# Patient Record
Sex: Male | Born: 1955 | Race: White | Hispanic: No | Marital: Married | Smoking: Never smoker
Health system: Southern US, Community
[De-identification: ages and names within clinical notes are randomized; demographics above are authoritative.]

## PROBLEM LIST (undated history)

## (undated) DIAGNOSIS — E119 Type 2 diabetes mellitus without complications: Secondary | ICD-10-CM

## (undated) DIAGNOSIS — R011 Cardiac murmur, unspecified: Secondary | ICD-10-CM

## (undated) DIAGNOSIS — K529 Noninfective gastroenteritis and colitis, unspecified: Secondary | ICD-10-CM

## (undated) DIAGNOSIS — H269 Unspecified cataract: Secondary | ICD-10-CM

## (undated) DIAGNOSIS — E785 Hyperlipidemia, unspecified: Secondary | ICD-10-CM

## (undated) DIAGNOSIS — E079 Disorder of thyroid, unspecified: Secondary | ICD-10-CM

## (undated) DIAGNOSIS — M1990 Unspecified osteoarthritis, unspecified site: Secondary | ICD-10-CM

## (undated) DIAGNOSIS — K5190 Ulcerative colitis, unspecified, without complications: Secondary | ICD-10-CM

## (undated) DIAGNOSIS — I10 Essential (primary) hypertension: Secondary | ICD-10-CM

## (undated) HISTORY — DX: Noninfective gastroenteritis and colitis, unspecified: K52.9

## (undated) HISTORY — PX: HERNIA REPAIR: SHX51

## (undated) HISTORY — DX: Type 2 diabetes mellitus without complications: E11.9

## (undated) HISTORY — DX: Cardiac murmur, unspecified: R01.1

## (undated) HISTORY — DX: Ulcerative colitis, unspecified, without complications: K51.90

## (undated) HISTORY — DX: Hyperlipidemia, unspecified: E78.5

## (undated) HISTORY — DX: Essential (primary) hypertension: I10

## (undated) HISTORY — DX: Disorder of thyroid, unspecified: E07.9

## (undated) HISTORY — PX: WISDOM TOOTH EXTRACTION: SHX21

## (undated) HISTORY — DX: Unspecified cataract: H26.9

## (undated) HISTORY — DX: Unspecified osteoarthritis, unspecified site: M19.90

---

## 2020-06-03 LAB — LIPID PANEL
Cholesterol: 137 (ref 0–200)
HDL: 46 (ref 35–70)
LDL Cholesterol: 71
Triglycerides: 122 (ref 40–160)

## 2020-06-03 LAB — PSA: PSA: 0.89

## 2020-06-03 LAB — MICROALBUMIN, URINE: Microalb, Ur: 12.2

## 2020-06-03 LAB — HEPATIC FUNCTION PANEL
ALT: 19 U/L (ref 10–40)
AST: 20 (ref 14–40)
Alkaline Phosphatase: 75 (ref 25–125)

## 2020-06-03 LAB — MICROALBUMIN / CREATININE URINE RATIO: Microalb Creat Ratio: 197

## 2020-09-17 LAB — HM COLONOSCOPY

## 2021-02-11 LAB — BASIC METABOLIC PANEL
BUN: 12 (ref 4–21)
CO2: 30 — AB (ref 13–22)
Chloride: 104 (ref 99–108)
Creatinine: 0.5 — AB (ref 0.6–1.3)
Glucose: 186
Potassium: 4.3 mEq/L (ref 3.5–5.1)
Sodium: 140 (ref 137–147)

## 2021-02-11 LAB — HEMOGLOBIN A1C: Hemoglobin A1C: 7.5

## 2021-02-11 LAB — COMPREHENSIVE METABOLIC PANEL
Calcium: 9.6 (ref 8.7–10.7)
eGFR: 90

## 2021-03-18 DIAGNOSIS — H353231 Exudative age-related macular degeneration, bilateral, with active choroidal neovascularization: Secondary | ICD-10-CM | POA: Diagnosis not present

## 2021-03-24 DIAGNOSIS — Z20822 Contact with and (suspected) exposure to covid-19: Secondary | ICD-10-CM | POA: Diagnosis not present

## 2021-04-15 DIAGNOSIS — H353231 Exudative age-related macular degeneration, bilateral, with active choroidal neovascularization: Secondary | ICD-10-CM | POA: Diagnosis not present

## 2021-05-06 ENCOUNTER — Ambulatory Visit (INDEPENDENT_AMBULATORY_CARE_PROVIDER_SITE_OTHER): Payer: Medicare Other | Admitting: Internal Medicine

## 2021-05-06 ENCOUNTER — Other Ambulatory Visit: Payer: Self-pay

## 2021-05-06 ENCOUNTER — Encounter: Payer: Self-pay | Admitting: Internal Medicine

## 2021-05-06 VITALS — BP 148/76 | HR 62 | Temp 98.2°F | Resp 16 | Ht 70.0 in | Wt 184.0 lb

## 2021-05-06 DIAGNOSIS — K5190 Ulcerative colitis, unspecified, without complications: Secondary | ICD-10-CM | POA: Insufficient documentation

## 2021-05-06 DIAGNOSIS — K5180 Other ulcerative colitis without complications: Secondary | ICD-10-CM

## 2021-05-06 DIAGNOSIS — Z0001 Encounter for general adult medical examination with abnormal findings: Secondary | ICD-10-CM | POA: Insufficient documentation

## 2021-05-06 DIAGNOSIS — E040 Nontoxic diffuse goiter: Secondary | ICD-10-CM | POA: Insufficient documentation

## 2021-05-06 DIAGNOSIS — I350 Nonrheumatic aortic (valve) stenosis: Secondary | ICD-10-CM | POA: Diagnosis not present

## 2021-05-06 DIAGNOSIS — I10 Essential (primary) hypertension: Secondary | ICD-10-CM | POA: Diagnosis not present

## 2021-05-06 DIAGNOSIS — E785 Hyperlipidemia, unspecified: Secondary | ICD-10-CM | POA: Diagnosis not present

## 2021-05-06 DIAGNOSIS — E118 Type 2 diabetes mellitus with unspecified complications: Secondary | ICD-10-CM

## 2021-05-06 DIAGNOSIS — E119 Type 2 diabetes mellitus without complications: Secondary | ICD-10-CM | POA: Diagnosis not present

## 2021-05-06 DIAGNOSIS — I351 Nonrheumatic aortic (valve) insufficiency: Secondary | ICD-10-CM | POA: Diagnosis not present

## 2021-05-06 DIAGNOSIS — R9431 Abnormal electrocardiogram [ECG] [EKG]: Secondary | ICD-10-CM | POA: Insufficient documentation

## 2021-05-06 DIAGNOSIS — Z23 Encounter for immunization: Secondary | ICD-10-CM | POA: Insufficient documentation

## 2021-05-06 MED ORDER — ASPIRIN 81 MG PO TBEC: 81.0000 mg | DELAYED_RELEASE_TABLET | Freq: Every day | ORAL | 1 refills | Status: DC

## 2021-05-06 MED ORDER — BOOSTRIX 5-2.5-18.5 LF-MCG/0.5 IM SUSP: 0.5000 mL | Freq: Once | INTRAMUSCULAR | 0 refills | Status: AC

## 2021-05-06 MED ORDER — SHINGRIX 50 MCG/0.5ML IM SUSR: 0.5000 mL | Freq: Once | INTRAMUSCULAR | 1 refills | Status: AC

## 2021-05-06 MED ORDER — OLMESARTAN MEDOXOMIL 20 MG PO TABS: 20.0000 mg | ORAL_TABLET | Freq: Every day | ORAL | 0 refills | Status: DC

## 2021-05-06 NOTE — Patient Instructions (Signed)

## 2021-05-06 NOTE — Progress Notes (Signed)
Subjective:  Patient ID: Bruce Sanchez, male    DOB: Jan 10, 1956  Age: 66 y.o. MRN: [id number]  CC: Annual Exam, Hypertension, Hyperlipidemia, and Diabetes  This visit occurred during the SARS-CoV-2 public health emergency.  Safety protocols were in place, including screening questions prior to the visit, additional usage of staff PPE, and extensive cleaning of exam room while observing appropriate contact time as indicated for disinfecting solutions.    HPI Bruce Sanchez presents for a CPX and to establish.  He had an episode of chest pressure one month ago. He has had no more CP since then.  [place] and he was told by his previous physician that he needed to have an evaluation for a heart murmur.  He denies palpitations, dizziness, lightheadedness, shortness of breath, dyspnea on exertion, edema, or fatigue.  He has chronic diarrhea caused by ulcerative colitis but denies any recent episodes of bloody stool, melena, abdominal pain, nausea, vomiting, loss of appetite, or weight loss.  He has a right thyroid goiter.  History Bruce Sanchez has a past medical history of Colitis, Diabetes mellitus without complication (HCC), Hyperlipidemia, and Hypertension.   He has a past surgical history that includes Hernia repair and Wisdom tooth extraction.   His family history includes Arthritis in his mother; Diabetes in his brother and mother; Heart attack in his father and mother; Heart disease in his father.He reports that he has never smoked. He has never used smokeless tobacco. He reports current alcohol use of about 7.0 standard drinks per week. He reports that he does not use drugs.  Outpatient Medications Prior to Visit  Medication Sig Dispense Refill   amLODipine-atorvastatin (CADUET) 10-40 MG tablet Take 1 tablet by mouth daily.     atorvastatin (LIPITOR) 40 MG tablet Take 40 mg by mouth daily.     dicyclomine (BENTYL) 10 MG capsule Take by mouth.     Mesalamine 800 MG  TBEC Take by mouth.     metFORMIN (GLUCOPHAGE-XR) 750 MG 24 hr tablet Take by mouth.     metoprolol succinate (TOPROL-XL) 50 MG 24 hr tablet Take 1 tablet by mouth daily.     sulfaSALAzine (AZULFIDINE) 500 MG tablet Take 1,000 mg by mouth 2 (two) times daily.     empagliflozin (JARDIANCE) 10 MG TABS tablet Take 1 tablet by mouth daily.     No facility-administered medications prior to visit.    ROS Review of Systems  Constitutional:  Negative for chills, diaphoresis, fatigue and fever.  HENT: Negative.    Eyes: Negative.   Respiratory:  Negative for cough, chest tightness, shortness of breath and wheezing.   Cardiovascular:  Negative for chest pain, palpitations and leg swelling.  Gastrointestinal:  Positive for diarrhea. Negative for abdominal pain, blood in stool, constipation, nausea, rectal pain and vomiting.  Endocrine: Negative.   Genitourinary: Negative.  Negative for difficulty urinating, penile swelling and scrotal swelling.  Musculoskeletal:  Negative for arthralgias and myalgias.  Skin: Negative.   Neurological:  Negative for dizziness, weakness, light-headedness and headaches.  Hematological:  Negative for adenopathy. Does not bruise/bleed easily.  Psychiatric/Behavioral: Negative.     Objective:  BP (!) 148/76 (BP Location: Right Arm, Patient Position: Sitting, Cuff Size: Large)    Pulse 62    Temp 98.2 F (36.8 C) (Oral)    Resp 16    Ht 5\' 10"  (1.778 m)    Wt 184 lb (83.5 kg)    SpO2 93%    BMI 26.40 kg/m  Physical Exam Vitals reviewed.  HENT:     Nose: Nose normal.     Mouth/Throat:     Mouth: Mucous membranes are moist.  Eyes:     General: No scleral icterus.    Conjunctiva/sclera: Conjunctivae normal.  Neck:     Thyroid: Thyroid mass and thyromegaly present. No thyroid tenderness.   Cardiovascular:     Rate and Rhythm: Normal rate and regular rhythm.     Heart sounds: Murmur heard.  Crescendo systolic murmur is present with a grade of 1/6.    No  friction rub. No gallop.     Comments: 1/6 Harsh SEM RUSB  EKG- NSR, 68 bpm Septal infarct pattern No LVH No prior EKG's available Pulmonary:     Effort: Pulmonary effort is normal.     Breath sounds: No stridor. No wheezing, rhonchi or rales.  Abdominal:     General: Abdomen is flat.     Palpations: There is no mass.     Tenderness: There is no abdominal tenderness. There is no guarding or rebound.     Hernia: No hernia is present.  Musculoskeletal:     Cervical back: Normal range of motion.     Right lower leg: No edema.     Left lower leg: No edema.  Lymphadenopathy:     Cervical: No cervical adenopathy.  Skin:    General: Skin is dry.  Neurological:     General: No focal deficit present.     Mental Status: He is alert. Mental status is at baseline.  Psychiatric:        Mood and Affect: Mood normal.        Behavior: Behavior normal.    Lab Results  Component Value Date   CHOL 137 06/03/2020   TRIG 122 06/03/2020   HDL 46 06/03/2020   LDLCALC 71 06/03/2020   ALT 19 06/03/2020   AST 20 06/03/2020   NA 140 02/11/2021   K 4.3 02/11/2021   CL 104 02/11/2021   CREATININE 0.5 (A) 02/11/2021   BUN 12 02/11/2021   CO2 30 (A) 02/11/2021   PSA 0.89 06/03/2020   HGBA1C 7.5 02/11/2021   MICROALBUR 12.2 06/03/2020     Assessment & Plan:   Bruce Sanchez was seen today for annual exam, hypertension, hyperlipidemia and diabetes.  Diagnoses and all orders for this visit:  Encounter for general adult medical examination with abnormal findings- Exam completed, labs reviewed, vaccines reviewed and updated, cancer screenings are up-to-date, patient education was given.  Type II diabetes mellitus with manifestations (HCC)- His last A1c was 7.5%.  Will increase the dose of the SGLT2 inhibitor. -     HM Diabetes Foot Exam -     olmesartan (BENICAR) 20 MG tablet; Take 1 tablet (20 mg total) by mouth daily. -     empagliflozin (JARDIANCE) 25 MG TABS tablet; Take 1 tablet (25 mg total)  by mouth daily before breakfast. -     Ambulatory referral to Ophthalmology  Primary hypertension- He has not achieved his blood pressure goal of 130/80.  Will start an ARB. -     EKG 12-Lead -     olmesartan (BENICAR) 20 MG tablet; Take 1 tablet (20 mg total) by mouth daily.  Aortic ejection murmur- Will evaluate for valvular heart disease. -     EKG 12-Lead -     ECHOCARDIOGRAM COMPLETE; Future -     MYOCARDIAL PERFUSION IMAGING; Future  Need for shingles vaccine -     Zoster  Vaccine Adjuvanted [facility]) injection; Inject 0.5 mLs into the muscle once for 1 dose.  Need for prophylactic vaccination with combined diphtheria-tetanus-pertussis (DTP) vaccine -     Tdap (BOOSTRIX) 5-2.5-18.5 LF-MCG/0.5 injection; Inject 0.5 mLs into the muscle once for 1 dose.  Abnormal electrocardiogram (ECG) (EKG)- Will evaluate for ischemia and infarct. -     ECHOCARDIOGRAM COMPLETE; Future -     MYOCARDIAL PERFUSION IMAGING; Future -     aspirin 81 MG EC tablet; Take 1 tablet (81 mg total) by mouth daily. Swallow whole. -     Cardiac Stress Test: Informed Consent Details: Physician/Practitioner Attestation; Transcribe to consent form and obtain patient signature; Future  Abnormal electrocardiogram -     ECHOCARDIOGRAM COMPLETE; Future -     MYOCARDIAL PERFUSION IMAGING; Future -     aspirin 81 MG EC tablet; Take 1 tablet (81 mg total) by mouth daily. Swallow whole. -     Cardiac Stress Test: Informed Consent Details: Physician/Practitioner Attestation; Transcribe to consent form and obtain patient signature; Future  Goiter, simple- He is euthyroid.  Hyperlipidemia LDL goal <70- LDL goal achieved. Doing well on the statin  -     aspirin 81 MG EC tablet; Take 1 tablet (81 mg total) by mouth daily. Swallow whole.  Other ulcerative colitis without complication (HCC) -     Ambulatory referral to Gastroenterology   I have discontinued Bruce Fast Loppnow's empagliflozin. I am also having him start on  Boostrix, Shingrix, olmesartan, aspirin, and empagliflozin. Additionally, I am having him maintain his amLODipine-atorvastatin, atorvastatin, dicyclomine, Mesalamine, metFORMIN, metoprolol succinate, and sulfaSALAzine.  Meds ordered this encounter  Medications   Tdap (BOOSTRIX) 5-2.5-18.5 LF-MCG/0.5 injection    Sig: Inject 0.5 mLs into the muscle once for 1 dose.    Dispense:  0.5 mL    Refill:  0   Zoster Vaccine Adjuvanted [facility]) injection    Sig: Inject 0.5 mLs into the muscle once for 1 dose.    Dispense:  0.5 mL    Refill:  1   olmesartan (BENICAR) 20 MG tablet    Sig: Take 1 tablet (20 mg total) by mouth daily.    Dispense:  90 tablet    Refill:  0   aspirin 81 MG EC tablet    Sig: Take 1 tablet (81 mg total) by mouth daily. Swallow whole.    Dispense:  90 tablet    Refill:  1   empagliflozin (JARDIANCE) 25 MG TABS tablet    Sig: Take 1 tablet (25 mg total) by mouth daily before breakfast.    Dispense:  30 tablet    Refill:  1     Follow-up: Return in about 3 months (around 08/06/2021).  Sanda Linger, MD

## 2021-05-08 ENCOUNTER — Encounter: Payer: Self-pay | Admitting: Internal Medicine

## 2021-05-08 MED ORDER — EMPAGLIFLOZIN 25 MG PO TABS: 25.0000 mg | ORAL_TABLET | Freq: Every day | ORAL | 1 refills | Status: DC

## 2021-05-13 ENCOUNTER — Telehealth: Payer: Self-pay

## 2021-05-13 NOTE — Telephone Encounter (Signed)
Pt has been informed that cardio testing was entered not a referral. He expressed understanding but stated no one has reached out to schedule the testing. I will follow up with referral team in regard. ?

## 2021-05-13 NOTE — Telephone Encounter (Signed)
Pt is calling to check the update on the Cardiology referral. Pt states he havent heard from anyone as far as referrals. ? ?I advised pt it was a referral in for: ?Ophthalmology ?Gastroenterology ? ?FYI ?

## 2021-05-19 ENCOUNTER — Telehealth (HOSPITAL_COMMUNITY): Payer: Self-pay | Admitting: *Deleted

## 2021-05-19 ENCOUNTER — Encounter (HOSPITAL_COMMUNITY): Payer: Self-pay | Admitting: Internal Medicine

## 2021-05-19 NOTE — Telephone Encounter (Signed)
Patient given detailed instructions per Myocardial Perfusion Study Information Sheet for the test on 05/20/21 Patient notified to arrive 15 minutes early and that it is imperative to arrive on time for appointment to keep from having the test rescheduled. ? If you need to cancel or reschedule your appointment, please call the office within 24 hours of your appointment. . Patient verbalized understanding. Bruce Sanchez [name] ? ? ?

## 2021-05-20 ENCOUNTER — Ambulatory Visit (HOSPITAL_COMMUNITY): Payer: Medicare Other | Attending: Internal Medicine

## 2021-05-20 ENCOUNTER — Other Ambulatory Visit: Payer: Self-pay

## 2021-05-20 DIAGNOSIS — R9431 Abnormal electrocardiogram [ECG] [EKG]: Secondary | ICD-10-CM | POA: Diagnosis not present

## 2021-05-20 DIAGNOSIS — I351 Nonrheumatic aortic (valve) insufficiency: Secondary | ICD-10-CM | POA: Insufficient documentation

## 2021-05-20 LAB — MYOCARDIAL PERFUSION IMAGING
LV dias vol: 115 mL (ref 62–150)
LV sys vol: 51 mL
Nuc Stress EF: 72 %
Peak HR: 112 {beats}/min
Rest HR: 71 {beats}/min
Rest Nuclear Isotope Dose: 10.8 mCi
SDS: 1
SRS: 0
SSS: 1
ST Depression (mm): 0 mm
Stress Nuclear Isotope Dose: 32.9 mCi
TID: 1.02

## 2021-05-20 MED ORDER — REGADENOSON 0.4 MG/5ML IV SOLN
0.4000 mg | Freq: Once | INTRAVENOUS | Status: AC
  Administered 2021-05-20: 0.4 mg via INTRAVENOUS

## 2021-05-20 MED ORDER — TECHNETIUM TC 99M TETROFOSMIN IV KIT
32.9000 | PACK | Freq: Once | INTRAVENOUS | Status: AC | PRN
  Administered 2021-05-20: 32.9 via INTRAVENOUS
  Filled 2021-05-20: qty 33

## 2021-05-20 MED ORDER — TECHNETIUM TC 99M TETROFOSMIN IV KIT
10.8000 | PACK | Freq: Once | INTRAVENOUS | Status: AC | PRN
  Administered 2021-05-20: 10.8 via INTRAVENOUS
  Filled 2021-05-20: qty 11

## 2021-05-27 DIAGNOSIS — H353231 Exudative age-related macular degeneration, bilateral, with active choroidal neovascularization: Secondary | ICD-10-CM | POA: Diagnosis not present

## 2021-06-02 ENCOUNTER — Ambulatory Visit (INDEPENDENT_AMBULATORY_CARE_PROVIDER_SITE_OTHER): Payer: Medicare Other

## 2021-06-02 DIAGNOSIS — I350 Nonrheumatic aortic (valve) stenosis: Secondary | ICD-10-CM | POA: Insufficient documentation

## 2021-06-02 DIAGNOSIS — R9431 Abnormal electrocardiogram [ECG] [EKG]: Secondary | ICD-10-CM | POA: Diagnosis not present

## 2021-06-02 DIAGNOSIS — I351 Nonrheumatic aortic (valve) insufficiency: Secondary | ICD-10-CM

## 2021-06-02 LAB — ECHOCARDIOGRAM COMPLETE
AR max vel: 1.26 cm2
AV Area VTI: 1.21 cm2
AV Area mean vel: 1.13 cm2
AV Mean grad: 15 mmHg
AV Peak grad: 25.9 mmHg
Ao pk vel: 2.54 m/s
Area-P 1/2: 2.76 cm2
Calc EF: 64.6 %
S' Lateral: 2.55 cm
Single Plane A2C EF: 58.3 %
Single Plane A4C EF: 69.9 %

## 2021-07-08 DIAGNOSIS — H2513 Age-related nuclear cataract, bilateral: Secondary | ICD-10-CM | POA: Diagnosis not present

## 2021-07-08 DIAGNOSIS — H353231 Exudative age-related macular degeneration, bilateral, with active choroidal neovascularization: Secondary | ICD-10-CM | POA: Diagnosis not present

## 2021-07-08 LAB — HM DIABETES EYE EXAM

## 2021-07-28 ENCOUNTER — Encounter: Payer: Self-pay | Admitting: Internal Medicine

## 2021-08-03 ENCOUNTER — Other Ambulatory Visit: Payer: Self-pay | Admitting: Internal Medicine

## 2021-08-03 DIAGNOSIS — I10 Essential (primary) hypertension: Secondary | ICD-10-CM

## 2021-08-03 DIAGNOSIS — E118 Type 2 diabetes mellitus with unspecified complications: Secondary | ICD-10-CM

## 2021-08-05 DIAGNOSIS — E119 Type 2 diabetes mellitus without complications: Secondary | ICD-10-CM | POA: Diagnosis not present

## 2021-08-11 ENCOUNTER — Ambulatory Visit (INDEPENDENT_AMBULATORY_CARE_PROVIDER_SITE_OTHER): Payer: Medicare Other

## 2021-08-11 ENCOUNTER — Ambulatory Visit (INDEPENDENT_AMBULATORY_CARE_PROVIDER_SITE_OTHER): Payer: Medicare Other | Admitting: Internal Medicine

## 2021-08-11 ENCOUNTER — Encounter: Payer: Self-pay | Admitting: Internal Medicine

## 2021-08-11 VITALS — BP 136/64 | HR 79 | Temp 98.0°F | Ht 70.0 in | Wt 186.0 lb

## 2021-08-11 DIAGNOSIS — E118 Type 2 diabetes mellitus with unspecified complications: Secondary | ICD-10-CM | POA: Diagnosis not present

## 2021-08-11 DIAGNOSIS — K5180 Other ulcerative colitis without complications: Secondary | ICD-10-CM | POA: Diagnosis not present

## 2021-08-11 DIAGNOSIS — E040 Nontoxic diffuse goiter: Secondary | ICD-10-CM | POA: Diagnosis not present

## 2021-08-11 DIAGNOSIS — M542 Cervicalgia: Secondary | ICD-10-CM

## 2021-08-11 DIAGNOSIS — M8588 Other specified disorders of bone density and structure, other site: Secondary | ICD-10-CM

## 2021-08-11 DIAGNOSIS — G8929 Other chronic pain: Secondary | ICD-10-CM

## 2021-08-11 DIAGNOSIS — R937 Abnormal findings on diagnostic imaging of other parts of musculoskeletal system: Secondary | ICD-10-CM

## 2021-08-11 DIAGNOSIS — I350 Nonrheumatic aortic (valve) stenosis: Secondary | ICD-10-CM | POA: Diagnosis not present

## 2021-08-11 DIAGNOSIS — I10 Essential (primary) hypertension: Secondary | ICD-10-CM

## 2021-08-11 DIAGNOSIS — Z125 Encounter for screening for malignant neoplasm of prostate: Secondary | ICD-10-CM

## 2021-08-11 DIAGNOSIS — E785 Hyperlipidemia, unspecified: Secondary | ICD-10-CM

## 2021-08-11 DIAGNOSIS — Z23 Encounter for immunization: Secondary | ICD-10-CM | POA: Diagnosis not present

## 2021-08-11 LAB — MICROALBUMIN / CREATININE URINE RATIO
Creatinine,U: 128.3 mg/dL
Microalb Creat Ratio: 12.3 mg/g (ref 0.0–30.0)
Microalb, Ur: 15.8 mg/dL — ABNORMAL HIGH (ref 0.0–1.9)

## 2021-08-11 LAB — BASIC METABOLIC PANEL
BUN: 14 mg/dL (ref 6–23)
CO2: 32 mEq/L (ref 19–32)
Calcium: 9.4 mg/dL (ref 8.4–10.5)
Chloride: 101 mEq/L (ref 96–112)
Creatinine, Ser: 0.64 mg/dL (ref 0.40–1.50)
GFR: 97.59 mL/min (ref >60.00)
Glucose, Bld: 174 mg/dL — ABNORMAL HIGH (ref 70–99)
Potassium: 3.8 mEq/L (ref 3.5–5.1)
Sodium: 140 mEq/L (ref 135–145)

## 2021-08-11 LAB — LIPID PANEL
Cholesterol: 111 mg/dL (ref 0–200)
HDL: 44.5 mg/dL (ref >39.00)
LDL Cholesterol: 48 mg/dL (ref 0–99)
NonHDL: 66.4
Total CHOL/HDL Ratio: 2
Triglycerides: 90 mg/dL (ref 0.0–149.0)
VLDL: 18 mg/dL (ref 0.0–40.0)

## 2021-08-11 LAB — TSH: TSH: 1.14 u[IU]/mL (ref 0.35–5.50)

## 2021-08-11 LAB — PSA: PSA: 0.95 ng/mL (ref 0.10–4.00)

## 2021-08-11 LAB — HEMOGLOBIN A1C: Hgb A1c MFr Bld: 6 % (ref 4.6–6.5)

## 2021-08-11 MED ORDER — BOOSTRIX 5-2.5-18.5 LF-MCG/0.5 IM SUSP: 0.5000 mL | Freq: Once | INTRAMUSCULAR | 0 refills | Status: AC

## 2021-08-11 NOTE — Patient Instructions (Signed)

## 2021-08-11 NOTE — Progress Notes (Unsigned)
Subjective:  Patient ID: Bruce Sanchez, male    DOB: 1955-09-20  Age: 66 y.o. MRN: [id number]  CC: Hypertension, Hyperlipidemia, and Diabetes   HPI Bruce Sanchez presents for f/up -  His last episode of shortness of breath was 3 weeks ago.  He denies chest pain, diaphoresis, dizziness, lightheadedness, or near syncope.  He tells me that mesalamine is more effective than sulfasalazine and he requests that I discontinue sulfasalazine and start mesalamine.  He also complains of chronic worsening posterior neck pain.  The pain does not radiate into his extremities and he denies paresthesias.  He has been taking Motrin for the pain.  He is due for follow-up on a thyroid goiter.  He thinks it is growing but it does not cause any trouble with swallowing or breathing.  His recent echocardiogram showed moderate aortic stenosis.  Outpatient Medications Prior to Visit  Medication Sig Dispense Refill   amLODipine-atorvastatin (CADUET) 10-40 MG tablet Take 1 tablet by mouth daily.     aspirin 81 MG EC tablet Take 1 tablet (81 mg total) by mouth daily. Swallow whole. 90 tablet 1   atorvastatin (LIPITOR) 40 MG tablet Take 40 mg by mouth daily.     empagliflozin (JARDIANCE) 25 MG TABS tablet Take 1 tablet (25 mg total) by mouth daily before breakfast. 30 tablet 1   metFORMIN (GLUCOPHAGE-XR) 750 MG 24 hr tablet Take by mouth.     olmesartan (BENICAR) 20 MG tablet TAKE ONE TABLET BY MOUTH DAILY 90 tablet 0   Mesalamine 800 MG TBEC Take by mouth.     sulfaSALAzine (AZULFIDINE) 500 MG tablet Take 1,000 mg by mouth 2 (two) times daily.     dicyclomine (BENTYL) 10 MG capsule Take by mouth.     metoprolol succinate (TOPROL-XL) 50 MG 24 hr tablet Take 1 tablet by mouth daily.     No facility-administered medications prior to visit.    ROS Review of Systems  Constitutional: Negative.  Negative for diaphoresis and fatigue.  HENT: Negative.  Negative for sore throat and trouble swallowing.   Eyes: Negative.    Respiratory:  Positive for shortness of breath. Negative for cough, chest tightness, wheezing and stridor.   Cardiovascular:  Negative for chest pain, palpitations and leg swelling.  Gastrointestinal:  Positive for diarrhea. Negative for abdominal pain, anal bleeding, blood in stool, nausea, rectal pain and vomiting.  Genitourinary: Negative.   Musculoskeletal:  Positive for neck pain. Negative for back pain and myalgias.  Skin: Negative.  Negative for rash.  Neurological: Negative.  Negative for dizziness, weakness and numbness.  Hematological:  Negative for adenopathy. Does not bruise/bleed easily.    Objective:  BP 136/64 (BP Location: Right Arm, Patient Position: Sitting, Cuff Size: Large)   Pulse 79   Temp 98 F (36.7 C) (Oral)   Ht 5\' 10"  (1.778 m)   Wt 186 lb (84.4 kg)   SpO2 93%   BMI 26.69 kg/m   BP Readings from Last 3 Encounters:  08/11/21 136/64  05/06/21 (!) 148/76    Wt Readings from Last 3 Encounters:  08/11/21 186 lb (84.4 kg)  05/20/21 184 lb (83.5 kg)  05/06/21 184 lb (83.5 kg)    Physical Exam Vitals reviewed.  HENT:     Nose: Nose normal.     Mouth/Throat:     Mouth: Mucous membranes are moist.  Eyes:     General: No scleral icterus.    Conjunctiva/sclera: Conjunctivae normal.  Neck:     Thyroid: Thyromegaly present.  Vascular: No carotid bruit.  Cardiovascular:     Heart sounds: No murmur heard. Pulmonary:     Effort: Pulmonary effort is normal.     Breath sounds: No stridor. No wheezing, rhonchi or rales.  Abdominal:     General: Abdomen is flat.     Palpations: There is no mass.     Tenderness: There is no abdominal tenderness. There is no guarding.     Hernia: No hernia is present.  Musculoskeletal:        General: Normal range of motion.     Cervical back: Normal range of motion and neck supple. No erythema, spasms, tenderness or bony tenderness. No pain with movement. Normal range of motion.     Thoracic back: Normal.     Lumbar  back: Normal.     Right lower leg: No edema.     Left lower leg: No edema.  Lymphadenopathy:     Cervical: No cervical adenopathy.     Right cervical: No superficial, deep or posterior cervical adenopathy.    Left cervical: No superficial, deep or posterior cervical adenopathy.  Skin:    General: Skin is warm and dry.  Neurological:     General: No focal deficit present.     Mental Status: He is alert. Mental status is at baseline.     Sensory: Sensation is intact.     Motor: Motor function is intact.     Coordination: Coordination is intact.     Gait: Gait is intact.     Deep Tendon Reflexes: Reflexes normal.  Psychiatric:        Mood and Affect: Mood normal.        Behavior: Behavior normal.     Lab Results  Component Value Date   GLUCOSE 174 (H) 08/11/2021   CHOL 111 08/11/2021   TRIG 90.0 08/11/2021   HDL 44.50 08/11/2021   LDLCALC 48 08/11/2021   ALT 19 06/03/2020   AST 20 06/03/2020   NA 140 08/11/2021   K 3.8 08/11/2021   CL 101 08/11/2021   CREATININE 0.64 08/11/2021   BUN 14 08/11/2021   CO2 32 08/11/2021   TSH 1.14 08/11/2021   PSA 0.95 08/11/2021   HGBA1C 6.0 08/11/2021   MICROALBUR 15.8 (H) 08/11/2021    DG Cervical Spine Complete  Result Date: 08/12/2021 CLINICAL DATA:  Neck pain.  No known injury. EXAM: CERVICAL SPINE - COMPLETE 4+ VIEW COMPARISON:  None Available. FINDINGS: No acute fracture or dislocation. Apparent slight anterior positioning of the tip of the odontoid in relation to the occipital condyle, likely positional or related to degenerative changes. CT may provide better evaluation if there is clinical concern for acute injury. The bones are osteopenic. Bridging osteophyte anterior to the spine consistent with diffuse idiopathic skeletal hyperostosis. The visualized posterior elements and odontoid intact. There is anatomic alignment of the lateral masses of C1 and C2. The soft tissues are unremarkable. IMPRESSION: 1. No acute fracture or  dislocation. 2. Apparent slight anterior positioning of the tip of the odontoid in relation to the occipital condyle, likely positional or related to degenerative changes. CT may provide better evaluation. Electronically Signed   By: Anner Crete M.D.   On: 08/12/2021 22:41     Assessment & Plan:   Bruce Sanchez was seen today for hypertension, hyperlipidemia and diabetes.  Diagnoses and all orders for this visit:  Chronic midline posterior neck pain- There is an abnormality in the odontoid.  I recommended that he undergo a CT  scan to further delineate this. -     DG Cervical Spine Complete; Future -     CT CERVICAL SPINE WO CONTRAST; Future  Goiter, simple- He is euthyroid. -     TSH; Future -     US THYROID; Future -     TSH  Type II diabetes mellitus with manifestations ([place])- His A1c is down to 6.0%.  Will discontinue metformin. -     Microalbumin / creatinine urine ratio; Future -     Hemoglobin A1c; Future -     Basic metabolic panel; Future -     Basic metabolic panel -     Hemoglobin A1c -     Microalbumin / creatinine urine ratio  Hyperlipidemia LDL goal <70- LDL goal achieved. Doing well on the statin  -     Lipid panel; Future -     Lipid panel  Need for prophylactic vaccination with combined diphtheria-tetanus-pertussis (DTP) vaccine -     Tdap (BOOSTRIX) 5-2.5-18.5 LF-MCG/0.5 injection; Inject 0.5 mLs into the muscle once for 1 dose.  Primary hypertension- His blood pressure is well controlled. -     Urinalysis, Routine w reflex microscopic; Future -     Basic metabolic panel; Future -     Basic metabolic panel -     Urinalysis, Routine w reflex microscopic  Screening for prostate cancer -     PSA; Future -     PSA  Aortic stenosis, mild- I have asked him to see cardiology to see if this is contributing to the shortness of breath. -     Ambulatory referral to Cardiology  Osteopenia of spine- I recommended that he undergo a bone density test. -     DG Bone  Density; Future  Abnormal x-ray of cervical spine -     CT CERVICAL SPINE WO CONTRAST; Future  Cervicalgia -     CT CERVICAL SPINE WO CONTRAST; Future  Other ulcerative colitis without complication ([place])- Will change to mesalamine at his request. -     Mesalamine 800 MG TBEC; Take 1 tablet (800 mg total) by mouth 2 (two) times daily.   I have discontinued Bruce Sanchez's sulfaSALAzine. I have also changed his Mesalamine. Additionally, I am having him start on Boostrix. Lastly, I am having him maintain his amLODipine-atorvastatin, atorvastatin, dicyclomine, metFORMIN, metoprolol succinate, aspirin EC, empagliflozin, and olmesartan.  Meds ordered this encounter  Medications   Tdap (BOOSTRIX) 5-2.5-18.5 LF-MCG/0.5 injection    Sig: Inject 0.5 mLs into the muscle once for 1 dose.    Dispense:  0.5 mL    Refill:  0   Mesalamine 800 MG TBEC    Sig: Take 1 tablet (800 mg total) by mouth 2 (two) times daily.    Dispense:  180 tablet    Refill:  1   I spent 50 minutes in preparing to see the patient by review of recent labs, imaging and procedures, obtaining and reviewing separately obtained history, communicating with the patient and his wife, ordering medications, tests or procedures, and documenting clinical information in the EHR including the differential Dx, treatment, and any further evaluation and management of multiple complex medical issues.     Follow-up: Return in about 3 months (around 11/11/2021).  Scarlette Calico, MD

## 2021-08-12 LAB — URINALYSIS, ROUTINE W REFLEX MICROSCOPIC
Bilirubin Urine: NEGATIVE
Hgb urine dipstick: NEGATIVE
Leukocytes,Ua: NEGATIVE
Nitrite: NEGATIVE
RBC / HPF: NONE SEEN (ref 0–?)
Specific Gravity, Urine: >=1.03 — AB (ref 1.000–1.030)
Urine Glucose: >=1000 — AB
Urobilinogen, UA: 1 (ref 0.0–1.0)
pH: 5.5 (ref 5.0–8.0)

## 2021-08-13 DIAGNOSIS — M8588 Other specified disorders of bone density and structure, other site: Secondary | ICD-10-CM | POA: Insufficient documentation

## 2021-08-13 DIAGNOSIS — R937 Abnormal findings on diagnostic imaging of other parts of musculoskeletal system: Secondary | ICD-10-CM | POA: Insufficient documentation

## 2021-08-13 DIAGNOSIS — Z125 Encounter for screening for malignant neoplasm of prostate: Secondary | ICD-10-CM | POA: Insufficient documentation

## 2021-08-15 MED ORDER — MESALAMINE 800 MG PO TBEC: 1.0000 | DELAYED_RELEASE_TABLET | Freq: Two times a day (BID) | ORAL | 1 refills | Status: DC

## 2021-08-24 ENCOUNTER — Telehealth: Payer: Self-pay | Admitting: Gastroenterology

## 2021-08-26 DIAGNOSIS — H353231 Exudative age-related macular degeneration, bilateral, with active choroidal neovascularization: Secondary | ICD-10-CM | POA: Diagnosis not present

## 2021-08-26 NOTE — Telephone Encounter (Signed)
Records reviewed and notable for the following:  Past medical history: - Diabetes - HTN - HLD - Ulcerative colitis - OSA  Past surgical history: - Umbilical hernia repair - Left inguinal hernia repair  Medications: - Amlodipine-atorvastatin - ASA 81 mg - Lipitor 40 mg - Bentyl 10 mg - Jardiance 25 mg - Mesalamine 800 mg bid -Toprol-XL 50 mg daily - Olmesartan 20 mg daily  History of left-sided colitis diagnosed around age 66 maintained on mesalamine monotherapy.  Previously treated with sulfasalazine, acyclovir.  Last note from [facility] in [place] was 04/29/2009.  On that note, reportedly had a colonoscopy with left-sided colitis and dysplasia in 2009.  Endoscopy report is not available for review.  That note from 04/2009 is the most recent note for review.  Endoscopic History: - Colonoscopy (09/09/2020): Colon polyps, hemorrhoids  Labs: - 01/30/2009: Normal CMP, CBC   Available records quite limited.  Please try to obtain copies of any previous endoscopy reports for review.  Can otherwise schedule OV to establish care.

## 2021-09-01 NOTE — Progress Notes (Unsigned)
Cardiology Office Note   Date:  09/02/2021   ID:  Savannah Erbe, DOB 1955-09-11, MRN [id number]  PCP:  Etta Grandchild, MD  Cardiologist:   Rollene Rotunda, MD Referring:  Etta Grandchild, MD  Chief Complaint  Patient presents with   Chest Pain      History of Present Illness: Bruce Sanchez is a 66 y.o. male who presents for evaluation of aortic stenosis and SOB.  He was having some shortness of breath with activity.  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  He does not describe however substernal chest pressure, neck or arm discomfort.  He does not have palpitations, presyncope or syncope.  He has been noted over the years to have a murmur.  He was previously living in [facility].  He is establishing with his new provider and had an echo in April was  60 - 65%.  There was mild to moderate AS.  The aorta was said to be 40 mm.  Lexiscan Myoview was negative for ischemia in March 2023.    Of note he has been having some hand swelling and discomfort and he wonders if this could be related to his Lipitor.  He took himself off of the freestanding Lipitor he had but he has been on Caduet.  Past Medical History:  Diagnosis Date   Colitis    Diabetes mellitus without complication (HCC)    Hyperlipidemia    Hypertension     Past Surgical History:  Procedure Laterality Date   HERNIA REPAIR     WISDOM TOOTH EXTRACTION       Current Outpatient Medications  Medication Sig Dispense Refill   amLODipine (NORVASC) 10 MG tablet Take 1 tablet (10 mg total) by mouth daily. 90 tablet 3   dicyclomine (BENTYL) 10 MG capsule Take by mouth.     empagliflozin (JARDIANCE) 25 MG TABS tablet Take 1 tablet (25 mg total) by mouth daily before breakfast. 30 tablet 1   Metoprolol Succinate 25 MG CS24 Take 50 mg by mouth daily in the afternoon.     olmesartan (BENICAR) 20 MG tablet TAKE ONE TABLET BY MOUTH DAILY 90 tablet 0   Mesalamine 800 MG TBEC Take 1 tablet (800 mg total) by mouth 2 (two)  times daily. (Patient not taking: Reported on 09/02/2021) 180 tablet 1   No current facility-administered medications for this visit.    Allergies:   Trulicity [dulaglutide]    Social History:  The patient  reports that he has never smoked. He has never used smokeless tobacco. He reports current alcohol use of about 7.0 standard drinks of alcohol per week. He reports that he does not use drugs.   Family History:  The patient's family history includes Arthritis in his mother; Diabetes in his brother and mother; Heart attack in his father and mother; Heart disease in his father.    ROS:  Please see the history of present illness.   Otherwise, review of systems are positive for none.   All other systems are reviewed and negative.    PHYSICAL EXAM: VS:  BP 137/66 (BP Location: Left Arm, Patient Position: Sitting, Cuff Size: Large)   Pulse 62   Ht 5\' 10"  (1.778 m)   Wt 188 lb 3.2 oz (85.4 kg)   SpO2 96%   BMI 27.00 kg/m  , BMI Body mass index is 27 kg/m. GENERAL:  Well appearing HEENT:  Pupils equal round and reactive, fundi not visualized, oral  mucosa unremarkable NECK:  No jugular venous distention, waveform within normal limits, carotid upstroke brisk and symmetric, no bruits, positive  thyromegaly LYMPHATICS:  No cervical, inguinal adenopathy LUNGS:  Clear to auscultation bilaterally BACK:  No CVA tenderness CHEST:  Unremarkable HEART:  PMI not displaced or sustained,S1 and S2 within normal limits, no S3, no S4, no clicks, no rubs, 2 out of 6 apical systolic murmur early peaking, no diastolic murmurs murmurs ABD:  Flat, positive bowel sounds normal in frequency in pitch, no bruits, no rebound, no guarding, no midline pulsatile mass, no hepatomegaly, no splenomegaly EXT:  2 plus pulses throughout, no edema, no cyanosis no clubbing SKIN:  No rashes no nodules NEURO:  Cranial nerves II through XII grossly intact, motor grossly intact throughout PSYCH:  Cognitively intact, oriented to  person place and time    EKG:  EKG is ordered today. The ekg ordered today demonstrates sinus rhythm, rate 62, axis within normal limits, intervals within normal limits, no acute ST-T wave changes.   Recent Labs: 08/11/2021: BUN 14; Creatinine, Ser 0.64; Potassium 3.8; Sodium 140; TSH 1.14    Lipid Panel    Component Value Date/Time   CHOL 111 08/11/2021 1533   TRIG 90.0 08/11/2021 1533   HDL 44.50 08/11/2021 1533   CHOLHDL 2 08/11/2021 1533   VLDL 18.0 08/11/2021 1533   LDLCALC 48 08/11/2021 1533      Wt Readings from Last 3 Encounters:  09/02/21 188 lb 3.2 oz (85.4 kg)  08/11/21 186 lb (84.4 kg)  05/20/21 184 lb (83.5 kg)      Other studies Reviewed: Additional studies/ records that were reviewed today include: Perfusion study, echocardiogram, labs. Review of the above records demonstrates:  Please see elsewhere in the note.     ASSESSMENT AND PLAN:  Aortic stenosis: This is mild to moderate and not causing any symptoms.  I will follow this clinically and with repeat echocardiograms over the years.  We went over the anatomy and physiology of this.  Chest pain: He is describing some shortness of breath but actually denies chest discomfort.  He had a negative perfusion study.  No further work-up is suggested.  HTN: We talked about the goals of therapy with a blood pressure in the 120s.  He will keep an eye on this.  DM: A1c is down to 6.0 from 7.5.  Because of cost he is only taking his Jardiance every other day and I asked him to talk to Dr. Ronnald Ramp about this.  Dyslipidemia: He thinks his Lipitor is causing symptoms and so he stopped taking a 40 mg stand-alone pill that he was taking but did not really realize he was still taking this in the form of CAD away.  He is going to completely come off the Lipitor and I will give him amlodipine alone.  If however his hand swelling and discomfort does not go away he would be back on the Lipitor.  If we do prove that he has  discomfort related to the Lipitor I would like to try another medication probably Crestor.  We had a long conversation about this.  Aortic root enlargement:  This will need follow up CT in one year.    Current medicines are reviewed at length with the patient today.  The patient does not have concerns regarding medicines.  The following changes have been made: As above  Labs/ tests ordered today include:   Orders Placed This Encounter  Procedures   CT ANGIO CHEST AORTA W/CM &  OR WO/CM   EKG 12-Lead     Disposition:   FU with me in 1 year   Signed, Rollene Rotunda, MD  09/02/2021 11:56 AM    [facility]

## 2021-09-02 ENCOUNTER — Ambulatory Visit: Payer: Medicare Other | Admitting: Cardiology

## 2021-09-02 ENCOUNTER — Encounter: Payer: Self-pay | Admitting: Cardiology

## 2021-09-02 ENCOUNTER — Other Ambulatory Visit: Payer: Self-pay

## 2021-09-02 VITALS — BP 137/66 | HR 62 | Ht 70.0 in | Wt 188.2 lb

## 2021-09-02 DIAGNOSIS — E118 Type 2 diabetes mellitus with unspecified complications: Secondary | ICD-10-CM

## 2021-09-02 DIAGNOSIS — I77810 Thoracic aortic ectasia: Secondary | ICD-10-CM | POA: Diagnosis not present

## 2021-09-02 DIAGNOSIS — I350 Nonrheumatic aortic (valve) stenosis: Secondary | ICD-10-CM

## 2021-09-02 DIAGNOSIS — R072 Precordial pain: Secondary | ICD-10-CM | POA: Diagnosis not present

## 2021-09-02 DIAGNOSIS — I10 Essential (primary) hypertension: Secondary | ICD-10-CM | POA: Diagnosis not present

## 2021-09-02 DIAGNOSIS — Z01812 Encounter for preprocedural laboratory examination: Secondary | ICD-10-CM

## 2021-09-02 MED ORDER — AMLODIPINE BESYLATE 10 MG PO TABS: 10.0000 mg | ORAL_TABLET | Freq: Every day | ORAL | 3 refills | Status: DC

## 2021-09-02 NOTE — Patient Instructions (Addendum)
Medication Instructions:  STOP:Caduet START: Amlodipine 10 mg daily  *If you need a refill on your cardiac medications before your next appointment, please call your pharmacy*   Lab Work: Your physician recommends that you return for lab work 1 week prior to test [facility])    Testing/Procedures: CT Angiography (CTA) Aorta , is a special type of CT scan that uses a computer to produce multi-dimensional views of major blood vessels throughout the body. In CT angiography, a contrast material is injected through an IV to help visualize the blood vessels   July, 2024   Follow-Up: At [facility], you and your health needs are our priority.  As part of our continuing mission to provide you with exceptional heart care, we have created designated Provider Care Teams.  These Care Teams include your primary Cardiologist (physician) and Advanced Practice Providers (APPs -  Physician Assistants and Nurse Practitioners) who all work together to provide you with the care you need, when you need it.  We recommend signing up for the patient portal called "MyChart".  Sign up information is provided on this After Visit Summary.  MyChart is used to connect with patients for Virtual Visits (Telemedicine).  Patients are able to view lab/test results, encounter notes, upcoming appointments, etc.  Non-urgent messages can be sent to your provider as well.   To learn more about what you can do with MyChart, go to [email or web address].    Your next appointment:   1 year(s)  The format for your next appointment:   In Person  Provider:   Rollene Rotunda, MD {

## 2021-09-03 ENCOUNTER — Telehealth: Payer: Self-pay | Admitting: *Deleted

## 2021-09-03 NOTE — Telephone Encounter (Signed)
Rec'd fax pt need PA opn Mesalamine 800 mg. Submitted PA w/ Key: [id number].  Rec'd instant msg " We are unable to process your request for prior authorization for MESALAMINE TAB 800MG  DR for the above member due to [facility] has a denied request on file for MESALAMINE TAB 800MG  DR for this member before. No alternative was given.  

## 2021-09-07 ENCOUNTER — Encounter: Payer: Self-pay | Admitting: Gastroenterology

## 2021-09-15 ENCOUNTER — Ambulatory Visit
Admission: RE | Admit: 2021-09-15 | Discharge: 2021-09-15 | Disposition: A | Discharge status: Home / Self Care | Payer: Medicare Other | Source: Ambulatory Visit | Attending: Internal Medicine | Admitting: Internal Medicine

## 2021-09-15 DIAGNOSIS — M542 Cervicalgia: Secondary | ICD-10-CM

## 2021-09-15 DIAGNOSIS — R937 Abnormal findings on diagnostic imaging of other parts of musculoskeletal system: Secondary | ICD-10-CM

## 2021-09-15 DIAGNOSIS — G8929 Other chronic pain: Secondary | ICD-10-CM

## 2021-09-16 ENCOUNTER — Other Ambulatory Visit: Payer: Self-pay | Admitting: Internal Medicine

## 2021-09-16 DIAGNOSIS — M4802 Spinal stenosis, cervical region: Secondary | ICD-10-CM

## 2021-10-07 DIAGNOSIS — H2513 Age-related nuclear cataract, bilateral: Secondary | ICD-10-CM | POA: Diagnosis not present

## 2021-10-07 DIAGNOSIS — H353231 Exudative age-related macular degeneration, bilateral, with active choroidal neovascularization: Secondary | ICD-10-CM | POA: Diagnosis not present

## 2021-10-07 DIAGNOSIS — E113293 Type 2 diabetes mellitus with mild nonproliferative diabetic retinopathy without macular edema, bilateral: Secondary | ICD-10-CM | POA: Diagnosis not present

## 2021-10-13 ENCOUNTER — Ambulatory Visit: Payer: Medicare Other | Admitting: Gastroenterology

## 2021-10-14 ENCOUNTER — Ambulatory Visit
Admission: RE | Admit: 2021-10-14 | Discharge: 2021-10-14 | Disposition: A | Discharge status: Home / Self Care | Payer: Medicare Other | Source: Ambulatory Visit | Attending: Internal Medicine | Admitting: Internal Medicine

## 2021-10-14 DIAGNOSIS — E041 Nontoxic single thyroid nodule: Secondary | ICD-10-CM | POA: Diagnosis not present

## 2021-10-14 DIAGNOSIS — E040 Nontoxic diffuse goiter: Secondary | ICD-10-CM

## 2021-10-15 ENCOUNTER — Other Ambulatory Visit: Payer: Self-pay | Admitting: Internal Medicine

## 2021-10-15 DIAGNOSIS — E042 Nontoxic multinodular goiter: Secondary | ICD-10-CM | POA: Insufficient documentation

## 2021-11-03 ENCOUNTER — Ambulatory Visit: Payer: Medicare Other | Admitting: Orthopaedic Surgery

## 2021-11-06 ENCOUNTER — Ambulatory Visit: Payer: Medicare Other | Admitting: Orthopaedic Surgery

## 2021-11-09 DIAGNOSIS — E119 Type 2 diabetes mellitus without complications: Secondary | ICD-10-CM | POA: Diagnosis not present

## 2021-11-11 ENCOUNTER — Ambulatory Visit (INDEPENDENT_AMBULATORY_CARE_PROVIDER_SITE_OTHER): Payer: Medicare Other | Admitting: Internal Medicine

## 2021-11-11 ENCOUNTER — Encounter: Payer: Self-pay | Admitting: Internal Medicine

## 2021-11-11 ENCOUNTER — Telehealth: Payer: Self-pay

## 2021-11-11 VITALS — BP 130/68 | HR 65 | Temp 97.6°F | Ht 70.0 in | Wt 184.0 lb

## 2021-11-11 DIAGNOSIS — E785 Hyperlipidemia, unspecified: Secondary | ICD-10-CM | POA: Diagnosis not present

## 2021-11-11 DIAGNOSIS — I10 Essential (primary) hypertension: Secondary | ICD-10-CM

## 2021-11-11 DIAGNOSIS — E118 Type 2 diabetes mellitus with unspecified complications: Secondary | ICD-10-CM | POA: Diagnosis not present

## 2021-11-11 DIAGNOSIS — K5180 Other ulcerative colitis without complications: Secondary | ICD-10-CM

## 2021-11-11 LAB — HEPATIC FUNCTION PANEL
ALT: 19 U/L (ref 0–53)
AST: 20 U/L (ref 0–37)
Albumin: 4.1 g/dL (ref 3.5–5.2)
Alkaline Phosphatase: 62 U/L (ref 39–117)
Bilirubin, Direct: 0.1 mg/dL (ref 0.0–0.3)
Total Bilirubin: 0.5 mg/dL (ref 0.2–1.2)
Total Protein: 6.7 g/dL (ref 6.0–8.3)

## 2021-11-11 LAB — BASIC METABOLIC PANEL
BUN: 14 mg/dL (ref 6–23)
CO2: 28 mEq/L (ref 19–32)
Calcium: 9.4 mg/dL (ref 8.4–10.5)
Chloride: 105 mEq/L (ref 96–112)
Creatinine, Ser: 0.67 mg/dL (ref 0.40–1.50)
GFR: 96.08 mL/min (ref >60.00)
Glucose, Bld: 220 mg/dL — ABNORMAL HIGH (ref 70–99)
Potassium: 3.7 mEq/L (ref 3.5–5.1)
Sodium: 143 mEq/L (ref 135–145)

## 2021-11-11 LAB — CBC WITH DIFFERENTIAL/PLATELET
Basophils Absolute: 0 10*3/uL (ref 0.0–0.1)
Basophils Relative: 0.4 % (ref 0.0–3.0)
Eosinophils Absolute: 0.1 10*3/uL (ref 0.0–0.7)
Eosinophils Relative: 1 % (ref 0.0–5.0)
HCT: 46.5 % (ref 39.0–52.0)
Hemoglobin: 15.5 g/dL (ref 13.0–17.0)
Lymphocytes Relative: 13.6 % (ref 12.0–46.0)
Lymphs Abs: 0.7 10*3/uL (ref 0.7–4.0)
MCHC: 33.3 g/dL (ref 30.0–36.0)
MCV: 90.1 fl (ref 78.0–100.0)
Monocytes Absolute: 0.6 10*3/uL (ref 0.1–1.0)
Monocytes Relative: 11.1 % (ref 3.0–12.0)
Neutro Abs: 4 10*3/uL (ref 1.4–7.7)
Neutrophils Relative %: 73.9 % (ref 43.0–77.0)
Platelets: 184 10*3/uL (ref 150.0–400.0)
RBC: 5.16 Mil/uL (ref 4.22–5.81)
RDW: 14.2 % (ref 11.5–15.5)
WBC: 5.4 10*3/uL (ref 4.0–10.5)

## 2021-11-11 LAB — HEMOGLOBIN A1C: Hgb A1c MFr Bld: 5.7 % (ref 4.6–6.5)

## 2021-11-11 MED ORDER — METOPROLOL SUCCINATE 25 MG PO CS24: 50.0000 mg | EXTENDED_RELEASE_CAPSULE | Freq: Every day | ORAL | 1 refills | Status: DC

## 2021-11-11 MED ORDER — SULFASALAZINE 500 MG PO TBEC: 1000.0000 mg | DELAYED_RELEASE_TABLET | Freq: Four times a day (QID) | ORAL | 1 refills | Status: DC

## 2021-11-11 NOTE — Progress Notes (Signed)
Subjective:  Patient ID: Bruce Sanchez, male    DOB: Oct 30, 1955  Age: 66 y.o. MRN: [id number]  CC: Follow-up   HPI Bruce Sanchez presents for f/up -  He is not taking mesalamine due to a lack of insurance coverage.  He is taking sulfasalazine 1000 mg 3 times daily but continues to complain of diarrhea.  He would like to increase the dose.  He has had no recent episodes of abdominal pain or bright red blood per rectum.  Outpatient Medications Prior to Visit  Medication Sig Dispense Refill   amLODipine (NORVASC) 10 MG tablet Take 1 tablet (10 mg total) by mouth daily. 90 tablet 3   empagliflozin (JARDIANCE) 25 MG TABS tablet Take 1 tablet (25 mg total) by mouth daily before breakfast. 30 tablet 1   Mesalamine 800 MG TBEC Take 1 tablet (800 mg total) by mouth 2 (two) times daily. 180 tablet 1   Metoprolol Succinate 25 MG CS24 Take 50 mg by mouth daily in the afternoon.     dicyclomine (BENTYL) 10 MG capsule Take by mouth.     olmesartan (BENICAR) 20 MG tablet TAKE ONE TABLET BY MOUTH DAILY (Patient not taking: Reported on 11/11/2021) 90 tablet 0   No facility-administered medications prior to visit.    ROS Review of Systems  Constitutional:  Negative for chills, diaphoresis, fatigue and fever.  HENT: Negative.    Eyes: Negative.   Respiratory:  Negative for cough, chest tightness, shortness of breath and wheezing.   Cardiovascular:  Negative for chest pain, palpitations and leg swelling.  Gastrointestinal:  Positive for diarrhea. Negative for abdominal pain, blood in stool, constipation, nausea and vomiting.  Endocrine: Negative.   Genitourinary: Negative.  Negative for difficulty urinating.  Musculoskeletal: Negative.   Skin: Negative.   Neurological: Negative.  Negative for dizziness, weakness, light-headedness and headaches.  Hematological:  Negative for adenopathy. Does not bruise/bleed easily.  Psychiatric/Behavioral: Negative.      Objective:  BP 130/68 (BP Location: Left  Arm, Patient Position: Sitting, Cuff Size: Large)   Pulse 65   Temp 97.6 F (36.4 C) (Oral)   Ht 5' 10"  (1.778 m)   Wt 184 lb (83.5 kg)   SpO2 94%   BMI 26.40 kg/m   BP Readings from Last 3 Encounters:  11/11/21 130/68  09/02/21 137/66  08/11/21 136/64    Wt Readings from Last 3 Encounters:  11/11/21 184 lb (83.5 kg)  09/02/21 188 lb 3.2 oz (85.4 kg)  08/11/21 186 lb (84.4 kg)    Physical Exam Vitals reviewed.  HENT:     Nose: Nose normal.     Mouth/Throat:     Mouth: Mucous membranes are moist.  Eyes:     General: No scleral icterus.    Conjunctiva/sclera: Conjunctivae normal.  Cardiovascular:     Rate and Rhythm: Normal rate and regular rhythm.     Heart sounds: Murmur heard.     Systolic murmur is present with a grade of 1/6.     No diastolic murmur is present.     No gallop.  Pulmonary:     Effort: Pulmonary effort is normal.     Breath sounds: No stridor. No wheezing, rhonchi or rales.  Abdominal:     Palpations: There is no mass.     Tenderness: There is no abdominal tenderness. There is no guarding.     Hernia: No hernia is present.  Musculoskeletal:        General: Normal range of motion.  Cervical back: Neck supple.     Right lower leg: No edema.     Left lower leg: No edema.  Lymphadenopathy:     Cervical: No cervical adenopathy.  Skin:    General: Skin is warm and dry.  Neurological:     General: No focal deficit present.     Mental Status: He is alert.     Lab Results  Component Value Date   WBC 5.4 11/11/2021   HGB 15.5 11/11/2021   HCT 46.5 11/11/2021   PLT 184.0 11/11/2021   GLUCOSE 220 (H) 11/11/2021   CHOL 111 08/11/2021   TRIG 90.0 08/11/2021   HDL 44.50 08/11/2021   LDLCALC 48 08/11/2021   ALT 19 11/11/2021   AST 20 11/11/2021   NA 143 11/11/2021   K 3.7 11/11/2021   CL 105 11/11/2021   CREATININE 0.67 11/11/2021   BUN 14 11/11/2021   CO2 28 11/11/2021   TSH 1.14 08/11/2021   PSA 0.95 08/11/2021   HGBA1C 5.7  11/11/2021   MICROALBUR 15.8 (H) 08/11/2021    US THYROID  Result Date: 10/15/2021 CLINICAL DATA:  Goiter EXAM: THYROID ULTRASOUND TECHNIQUE: Ultrasound examination of the thyroid gland and adjacent soft tissues was performed. COMPARISON:  None available FINDINGS: Parenchymal Echotexture: Moderately heterogeneous Isthmus: 0.6 cm Right lobe: 8.4 x 3.2 x 4.3 cm Left lobe: 10.2 x 2.8 x 5.2 cm _________________________________________________________ Estimated total number of nodules >/= 1 cm: 6 Number of spongiform nodules >/=  2 cm not described below (TR1): 0 Number of mixed cystic and solid nodules >/= 1.5 cm not described below (TR2): 0 _________________________________________________________ Nodule # 1: Location: Isthmus; left Maximum size: 2.2 cm; Other 2 dimensions: 1.5 x 1.4 cm Composition: solid/almost completely solid (2) Echogenicity: isoechoic (1) Shape: not taller-than-wide (0) Margins: ill-defined (0) Echogenic foci: none (0) ACR TI-RADS total points: 3. ACR TI-RADS risk category: TR3 (3 points). ACR TI-RADS recommendations: *Given size (>/= 1.5 - 2.4 cm) and appearance, a follow-up ultrasound in 1 year should be considered based on TI-RADS criteria. _________________________________________________________ Nodule # 2: Location: Right; mid Maximum size: 5.1 cm; Other 2 dimensions: 4.3 x 2.6 cm Composition: solid/almost completely solid (2) Echogenicity: isoechoic (1) Shape: not taller-than-wide (0) Margins: ill-defined (0) Echogenic foci: none (0) ACR TI-RADS total points: 3. ACR TI-RADS risk category: TR3 (3 points). ACR TI-RADS recommendations: **Given size (>/= 2.5 cm) and appearance, fine needle aspiration of this mildly suspicious nodule should be considered based on TI-RADS criteria. _________________________________________________________ Nodule # 3: Location: Right; mid Maximum size: 2.8 cm; Other 2 dimensions: 2.7 x 1.5 cm Composition: solid/almost completely solid (2) Echogenicity:  isoechoic (1) Shape: not taller-than-wide (0) Margins: ill-defined (0) Echogenic foci: none (0) ACR TI-RADS total points: 3. ACR TI-RADS risk category: TR3 (3 points). ACR TI-RADS recommendations: **Given size (>/= 2.5 cm) and appearance, fine needle aspiration of this mildly suspicious nodule should be considered based on TI-RADS criteria. _________________________________________________________ Nodule # 4: Location: Right; inferior Maximum size: 2.5 cm; Other 2 dimensions: 2.0 x 1.5 cm Composition: solid/almost completely solid (2) Echogenicity: isoechoic (1) Shape: not taller-than-wide (0) Margins: ill-defined (0) Echogenic foci: none (0) ACR TI-RADS total points: 3. ACR TI-RADS risk category: TR3 (3 points). ACR TI-RADS recommendations: **Given size (>/= 2.5 cm) and appearance, fine needle aspiration of this mildly suspicious nodule should be considered based on TI-RADS criteria. _________________________________________________________ Nodule # 5: Location: Right; inferior Maximum size: 2.4 cm; Other 2 dimensions: 1.7 x 1.6 cm Composition: solid/almost completely solid (2) Echogenicity: isoechoic (1) Shape:  not taller-than-wide (0) Margins: ill-defined (0) Echogenic foci: none (0) ACR TI-RADS total points: 3. ACR TI-RADS risk category: TR3 (3 points). ACR TI-RADS recommendations: *Given size (>/= 1.5 - 2.4 cm) and appearance, a follow-up ultrasound in 1 year should be considered based on TI-RADS criteria. _________________________________________________________ Nodule # 6: Location: Left; mid Maximum size: 4.1 cm; Other 2 dimensions: 3.8 x 2.7 cm Composition: solid/almost completely solid (2) Echogenicity: isoechoic (1) Shape: not taller-than-wide (0) Margins: ill-defined (0) Echogenic foci: macrocalcifications (1) ACR TI-RADS total points: 4. ACR TI-RADS risk category: TR4 (4-6 points). ACR TI-RADS recommendations: **Given size (>/= 1.5 cm) and appearance, fine needle aspiration of this moderately suspicious  nodule should be considered based on TI-RADS criteria. IMPRESSION: 1. Nodule 2 (TI-RADS 3), measuring 5.1 cm, located in the mid right thyroid lobe, meets criteria for FNA. 2. Nodule 6 (TI-RADS 4), measuring 4.1 cm, located in the mid left thyroid lobe, meets criteria for FNA. 3. Nodules 3, 4, and 5 meet criteria for imaging follow-up. Annual ultrasound surveillance is recommended until 5 years of stability is documented. The above is in keeping with the ACR TI-RADS recommendations - J Am Coll Radiol 2017;14:587-595. Electronically Signed   By: Miachel Roux M.D.   On: 10/15/2021 08:24    Assessment & Plan:   Adisa was seen today for follow-up.  Diagnoses and all orders for this visit:  Primary hypertension-his blood pressure is well controlled. -     Discontinue: Metoprolol Succinate 25 MG CS24; Take 50 mg by mouth daily in the afternoon. -     Basic metabolic panel; Future -     Basic metabolic panel -     metoprolol succinate (TOPROL-XL) 50 MG 24 hr tablet; Take 1 tablet (50 mg total) by mouth daily. Take with or immediately following a meal.  Other ulcerative colitis without complication (HCC)-will increase the dose of sulfasalazine. -     sulfaSALAzine (AZULFIDINE) 500 MG EC tablet; Take 2 tablets (1,000 mg total) by mouth 4 (four) times daily. -     CBC with Differential/Platelet; Future -     CBC with Differential/Platelet  Hyperlipidemia LDL goal <70 -     Hepatic function panel; Future -     Hepatic function panel  Type II diabetes mellitus with manifestations (HCC)-his blood sugar is very well controlled. -     Hemoglobin A1c; Future -     Hemoglobin A1c   I have discontinued Jori Moll Teuscher's olmesartan, Mesalamine, Metoprolol Succinate, and Metoprolol Succinate. I am also having him start on sulfaSALAzine and metoprolol succinate. Additionally, I am having him maintain his dicyclomine, empagliflozin, and amLODipine.  Meds ordered this encounter  Medications   DISCONTD:  Metoprolol Succinate 25 MG CS24    Sig: Take 50 mg by mouth daily in the afternoon.    Dispense:  90 capsule    Refill:  1   sulfaSALAzine (AZULFIDINE) 500 MG EC tablet    Sig: Take 2 tablets (1,000 mg total) by mouth 4 (four) times daily.    Dispense:  720 tablet    Refill:  1   metoprolol succinate (TOPROL-XL) 50 MG 24 hr tablet    Sig: Take 1 tablet (50 mg total) by mouth daily. Take with or immediately following a meal.    Dispense:  90 tablet    Refill:  1     Follow-up: Return in about 3 months (around 02/10/2022).  Scarlette Calico, MD

## 2021-11-11 NOTE — Telephone Encounter (Signed)
Dr. Jenny Reichmann pt pharmacy was wondering if it was ok for her to change pt metoprolol to 50 mg tablet instead of the original order that is showing the 23m since that is what he has been taken.

## 2021-11-11 NOTE — Patient Instructions (Signed)
Ulcerative Colitis, Adult  Ulcerative colitis is long-term (chronic) inflammation of the large intestine (colon) and rectum. Sores (ulcers) may also form in these areas. Ulcerative colitis, along with a closely related condition called Crohn's disease, is often referred to as inflammatory bowel disease. What are the causes? This condition may be caused by increased activity of the immune system in the intestines. The immune system is the system that protects the body against harmful bacteria, viruses, fungi, and other things that can make you sick. The cause of the increased activity of the immune system is not known. What increases the risk? The following factors may make you more likely to develop this condition: Being under 63 years old. Having a family history of ulcerative colitis. What are the signs or symptoms? Symptoms vary depending on how severe the condition is. Common symptoms include: Rectal bleeding. Diarrhea, often with blood or pus in the stool. Other symptoms can include: Pain or cramping in the abdomen. Fever. Tiredness (fatigue). Nausea, loss of appetite, or weight loss. Rectal pain. A strong and sudden need to have a bowel movement (bowel urgency). Anemia. Yellowing of the skin (jaundice) from liver dysfunction or skin rashes. Symptoms can range from mild to severe. They may come and go. How is this diagnosed? This condition may be diagnosed based on: Your symptoms and medical history. A physical exam. Tests, including: Blood tests and stool tests. X-rays. CT scan. MRI. Colonoscopy. For this test, a flexible tube is inserted into your anus, and your colon is examined. Biopsy. In this test, a tissue sample is taken from your colon and examined under a microscope. How is this treated? There is no cure for this condition, but it can be managed. Treatment depends on the severity of the disease. Treatment for this condition may include medicines to: Decrease  swelling and inflammation. Control your immune system. Treat infections. Relieve pain. Control diarrhea. Severe flare-ups may need to be treated at a hospital. Treatment in a hospital may involve: Resting the bowel. This involves not eating or drinking for a period of time. Getting medicines through an IV. Getting fluids and nutrition through: An IV. A tube that is passed through the nose and into the stomach (nasogastric or NG tube). Surgery to remove the affected part of the colon. This may be done if other treatments are not helping. This condition increases the risk of colon cancer. Adults with this condition will need to be checked for colon cancer throughout life. Follow these instructions at home: Medicines and vitamins Take over-the-counter and prescription medicines only as told by your health care provider. Do not take aspirin. If you were prescribed an antibiotic medicine, take it as told by your health care provider. Do not stop taking the antibiotic even if you start to feel better. Ask your health care provider if you should take any vitamins or supplements. You may need to take: Calcium and vitamin D for bone health. Iron to help treat anemia. Lifestyle Exercise regularly. Work with your health care provider to manage your condition and educate yourself about your condition. Do not use any products that contain nicotine or tobacco. These products include cigarettes, chewing tobacco, and vaping devices, such as e-cigarettes. If you need help quitting, ask your health care provider. If you drink alcohol: Limit how much you have to: 0-1 drink a day for women who are not pregnant. 0-2 drinks a day for men. Know how much alcohol is in a drink. In the [place] one drink equals one  12 oz bottle of beer (355 mL), one 5 oz glass of wine (148 mL), or one 1 oz glass of hard liquor (44 mL). Eating and drinking Keep a food diary. This may help you identify and avoid any foods that  trigger your symptoms. Drink enough fluid to keep your urine pale yellow. Follow a well-balanced diet as told by your health care provider. This may include: Avoiding carbonated drinks. Avoiding popcorn, vegetable skins, nuts, and other high-fiber foods. Avoiding high-fat foods. Eating smaller meals, but more often. Limiting sugary drinks. Limiting caffeine. Follow food safety recommendations as told by your health care provider. This may include making sure you: Avoid eating raw or undercooked meat, fish, or eggs. Do not eat or drink spoiled or expired foods and drinks. General instructions Wash your hands often with soap and water for at least 20 seconds. If soap and water are not available, use hand sanitizer. Stay up to date on your vaccinations, including a yearly (annual) flu shot. Ask your health care provider which vaccines you should get. Have cancer screening tests as told by your health care provider. Ulcerative colitis may place you at increased risk for colon cancer. Keep all follow-up visits. This is important. Where to find more information You can find some helpful and educational information about ulcerative colitis at the [facility] online here: [email or web address] Contact a health care provider if: Your symptoms do not improve or they get worse with treatment. You continue to lose weight. You have constant cramps or loose stools. You develop a new skin rash, skin sores, or eye problems. You have a fever or chills. Get help right away if: You have bloody diarrhea. You have severe bleeding from the rectum. You feel that your heart is racing. You have severe pain in your abdomen. Your abdomen swells (abdominal distension). Your abdomen is tender to the touch. You vomit. Summary Ulcerative colitis is long-lasting (chronic) inflammation of the large intestine (colon) and rectum. Sores (ulcers) may also form in these  areas. Follow instructions from your health care provider about medicines, lifestyle changes, and eating and drinking. Contact your health care provider if symptoms do not improve or they get worse with treatment. Get help right away if you have severe abdominal pain, abdominal swelling, or severe bleeding from the rectum. Keep all follow-up visits. This is important. This information is not intended to replace advice given to you by your health care provider. Make sure you discuss any questions you have with your health care provider. Document Revised: 10/23/2019 Document Reviewed: 10/23/2019 Elsevier Patient Education  Bruce Sanchez.

## 2021-11-12 MED ORDER — METOPROLOL SUCCINATE ER 50 MG PO TB24: 50.0000 mg | ORAL_TABLET | Freq: Every day | ORAL | 1 refills | Status: DC

## 2021-11-24 ENCOUNTER — Encounter: Payer: Self-pay | Admitting: Gastroenterology

## 2021-11-24 ENCOUNTER — Ambulatory Visit: Payer: Medicare Other | Admitting: Gastroenterology

## 2021-11-24 VITALS — BP 136/78 | HR 85 | Ht 70.0 in | Wt 183.0 lb

## 2021-11-24 DIAGNOSIS — K649 Unspecified hemorrhoids: Secondary | ICD-10-CM

## 2021-11-24 DIAGNOSIS — Z86010 Personal history of colonic polyps: Secondary | ICD-10-CM

## 2021-11-24 DIAGNOSIS — K5180 Other ulcerative colitis without complications: Secondary | ICD-10-CM | POA: Diagnosis not present

## 2021-11-24 DIAGNOSIS — R12 Heartburn: Secondary | ICD-10-CM

## 2021-11-24 MED ORDER — HYDROCORTISONE (PERIANAL) 2.5 % EX CREA: 1.0000 | TOPICAL_CREAM | Freq: Two times a day (BID) | CUTANEOUS | 3 refills | Status: DC | PRN

## 2021-11-24 NOTE — Patient Instructions (Addendum)
_______________________________________________________  If you are age 68 or older, your body mass index should be between 23-30. Your Body mass index is 26.26 kg/m. If this is out of the aforementioned range listed, please consider follow up with your Primary Care Provider.  ________________________________________________________  The [facility] providers would like to encourage you to use [facility] to communicate with providers for non-urgent requests or questions.  Due to long hold times on the telephone, sending your provider a message by [facility] may be a faster and more efficient way to get a response.  Please allow 48 business hours for a response.  Please remember that this is for non-urgent requests.  _______________________________________________________   Due to recent changes in healthcare laws, you may see the results of your imaging and laboratory studies on MyChart before your provider has had a chance to review them.  We understand that in some cases there may be results that are confusing or concerning to you. Not all laboratory results come back in the same time frame and the provider may be waiting for multiple results in order to interpret others.  Please give [place] 48 hours in order for your provider to thoroughly review all the results before contacting the office for clarification of your results.   We have sent the following medications to your pharmacy for you to pick up at your convenience:  Hydrocortisone  Please follow up in 4 months. Give [place] a call at [phone] to schedule an appointment.   Thank you for choosing me and [facility].  Vito Cirigliano, D.O.

## 2021-11-24 NOTE — Progress Notes (Signed)
Chief Complaint: Abdominal pain, nausea   Referring Provider:     Janith Lima, MD   HPI:     Bruce Sanchez is a 66 y.o. male with a history of HTN, HLD, diabetes, OSA, ulcerative colitis referred to the [facility] to establish care for his Ulcerative Colitis.  Limited medical records available for review and notable for the following:  History of left-sided colitis diagnosed around age 11 maintained on mesalamine monotherapy.  Previously treated with sulfasalazine, acyclovir.  Last note from [facility] in [place] was 04/29/2009.  On that note, reportedly had a colonoscopy with left-sided colitis and dysplasia in 2009.  Endoscopy report is not available for review.  That note from 04/2009 is the most recent note for review.  Endoscopic History: - Colonoscopy (09/09/2020): Colon polyps, hemorrhoids.  Recommended repeat in 3 years for polyp surveillance  Patient reports he has been taking sulfasalazine 1000 mg tid for years.  Discussed with his PCM recently by having occasional loose stools, but not frank diarrhea.  No hematochezia or melena. Increased to 1000 mg QID over the last 2 weeks.   No prior biologic, immunomodulator. Was treated with Prednisone at dx, and none in the last 30 years that he can recall.  Otherwise good p.o. intake.  No abdominal pain.  Longstanding history of hemorrhoids that are intermittently symptomatic.  Responsive to hemorrhoid cream.  Requesting Rx today.    Separately, Hx of GERD with index symptoms of heartburn.  Tends to be after eating late and certain foods.  Will use occasional OTC antacids or OTC omeprazole.  No dysphagia.  Reports having EGD in [place] years ago that was normal.    Reviewed most recent labs from 11/11/2021 as below: Normal CBC, CMP (BG 220).  A1c 5.7%.  No recent abdominal imaging for review.  Fhx n/f brother with UC as well, which was diagnosed in the last 2 years..      Latest Ref Rng  & Units 11/11/2021    2:31 PM  CBC  WBC 4.0 - 10.5 K/uL 5.4   Hemoglobin 13.0 - 17.0 g/dL 15.5   Hematocrit 39.0 - 52.0 % 46.5   Platelets 150.0 - 400.0 K/uL 184.0       Latest Ref Rng & Units 11/11/2021    2:31 PM 08/11/2021    3:33 PM 02/11/2021   12:00 AM  CMP  Glucose 70 - 99 mg/dL 220  174    BUN 6 - 23 mg/dL 14  14  12       Creatinine 0.40 - 1.50 mg/dL 0.67  0.64  0.5      Sodium 135 - 145 mEq/L 143  140  140      Potassium 3.5 - 5.1 mEq/L 3.7  3.8  4.3      Chloride 96 - 112 mEq/L 105  101  104      CO2 19 - 32 mEq/L 28  32  30      Calcium 8.4 - 10.5 mg/dL 9.4  9.4  9.6      Total Protein 6.0 - 8.3 g/dL 6.7     Total Bilirubin 0.2 - 1.2 mg/dL 0.5     Alkaline Phos 39 - 117 U/L 62     AST 0 - 37 U/L 20     ALT 0 - 53 U/L 19        This result is from an external  source.     Past Medical History:  Diagnosis Date   Colitis    Diabetes mellitus without complication ([place])    Hyperlipidemia    Hypertension    Ulcerative colitis ([place])      Past Surgical History:  Procedure Laterality Date   HERNIA REPAIR     WISDOM TOOTH EXTRACTION     Family History  Problem Relation Age of Onset   Diabetes Mother    Arthritis Mother    Heart attack Mother    Heart disease Father    Heart attack Father    Diabetes Brother    Stomach cancer Neg Hx    Colon cancer Neg Hx    Esophageal cancer Neg Hx    Social History   Tobacco Use   Smoking status: Never   Smokeless tobacco: Never  Vaping Use   Vaping Use: Never used  Substance Use Topics   Alcohol use: Yes    Alcohol/week: 7.0 standard drinks of alcohol    Types: 7 Glasses of wine per week    Comment: occassionally   Drug use: Never   Current Outpatient Medications  Medication Sig Dispense Refill   amLODipine (NORVASC) 10 MG tablet Take 1 tablet (10 mg total) by mouth daily. 90 tablet 3   empagliflozin (JARDIANCE) 25 MG TABS tablet Take 1 tablet (25 mg total) by mouth daily before breakfast. 30 tablet 1    metFORMIN (GLUCOPHAGE-XR) 500 MG 24 hr tablet Take 500 mg by mouth at bedtime.     metoprolol succinate (TOPROL-XL) 50 MG 24 hr tablet Take 1 tablet (50 mg total) by mouth daily. Take with or immediately following a meal. 90 tablet 1   sulfaSALAzine (AZULFIDINE) 500 MG EC tablet Take 2 tablets (1,000 mg total) by mouth 4 (four) times daily. 720 tablet 1   dicyclomine (BENTYL) 10 MG capsule Take by mouth.     No current facility-administered medications for this visit.   Allergies  Allergen Reactions   Trulicity [Dulaglutide] Nausea And Vomiting   Olmesartan Rash     Review of Systems: All systems reviewed and negative except where noted in HPI.     Physical Exam:    Wt Readings from Last 3 Encounters:  11/24/21 183 lb (83 kg)  11/11/21 184 lb (83.5 kg)  09/02/21 188 lb 3.2 oz (85.4 kg)    BP 136/78   Pulse 85   Ht 5' 10"  (1.778 m)   Wt 183 lb (83 kg)   BMI 26.26 kg/m  Constitutional:  Pleasant, in no acute distress. Psychiatric: Normal mood and affect. Behavior is normal. EENT: Pupils normal.  Conjunctivae are normal. No scleral icterus. Neck: Right-sided goiter.  No cervical LAD. Cardiovascular: Faint 2/6 SEM.  Normal rate, regular rhythm. No edema Pulmonary/chest: Effort normal and breath sounds normal. No wheezing, rales or rhonchi. Abdominal: Soft, nondistended, nontender. Bowel sounds active throughout. There are no masses palpable. No hepatomegaly. Neurological: Alert and oriented to person place and time. Skin: Skin is warm and dry. No rashes noted.   ASSESSMENT AND PLAN;   1) Ulcerative Colitis 66 year old male with longstanding history of ulcerative colitis.  Has had steroids many years ago. Previously on mesalamine, but that was no longer covered by insurance and subsequently switched to sulfasalazine monotherapy, which has maintained UC in remission for years.  Recently increased sulfasalazine to 1000 mg qid.  Unclear if symptoms he was treating with active  UC vs medication ADR (previously had GI symptoms with higher dose of metformin)  vs other.  - Continue sulfasalazine.  Can hopefully reduce back to tid dosing in the near future - Recent BMP normal.  Repeat in 1 year - Colonoscopy in 2025 for continued surveillance - If flare of symptoms, patient knows to call me and we will check labs  2) History of colon polyps - Last colonoscopy was in 2022 and notable for colon polyps.  Size, location, number, histology, etc. unknown, but patient was told to repeat in 3 years - Repeat colonoscopy in 2025 for continued surveillance  3) Hemorrhoids - Intermittently symptomatic - Placed Rx for Hydrocortisone 2.5% hemorrhoid cream  4) Heartburn - Dietary modification and avoidance of exacerbating type foods - Avoid eating within 2-3 hours of bedtime - If symptoms worsen, can trial PPI - We will discuss role/utility of upper endoscopy for BE screening at follow-up appointment   RTC in 4 months or sooner prn    Lavena Bullion, DO, FACG  11/24/2021, 3:50 PM   Janith Lima, MD

## 2021-11-29 ENCOUNTER — Other Ambulatory Visit: Payer: Self-pay | Admitting: Internal Medicine

## 2021-11-29 DIAGNOSIS — E118 Type 2 diabetes mellitus with unspecified complications: Secondary | ICD-10-CM

## 2021-12-01 ENCOUNTER — Encounter: Payer: Self-pay | Admitting: Orthopaedic Surgery

## 2021-12-01 ENCOUNTER — Ambulatory Visit: Payer: Medicare Other | Admitting: Orthopaedic Surgery

## 2021-12-01 DIAGNOSIS — M4810 Ankylosing hyperostosis [Forestier], site unspecified: Secondary | ICD-10-CM

## 2021-12-01 NOTE — Progress Notes (Signed)
Office Visit Note   Patient: Bruce Sanchez           Date of Birth: 1955-05-02           MRN: [id number] Visit Date: 12/01/2021              Requested by: Janith Lima, MD [id number] [place],  [place] [address] PCP: Janith Lima, MD   Assessment & Plan: Visit Diagnoses:  1. DISH (diffuse idiopathic skeletal hyperostosis)            Cervical spine  Plan: Cervical DISH.  No myelopathic symptoms.  Disc heights are maintained.  He did not have significant posterior endplate spurring.  If he develops upper extremity numbness or weakness upper or lower extremities he can return.  We discussed gentle stretching exercises to maintain what mobility has.  Follow-Up Instructions: No follow-ups on file.   Orders:  No orders of the defined types were placed in this encounter.  No orders of the defined types were placed in this encounter.     Procedures: No procedures performed   Clinical Data: No additional findings.   Subjective: Chief Complaint  Patient presents with   Neck - Pain    HPI 66 year old male with history of thyroid goiters had needle biopsy and CT scan noted that he had DISH in the cervical spine.  Noted that he has had tilted to 1 side with limited range of motion has some problems looking up some looking down.  He works part-time as a Biomedical scientist.  He has not really had persistent numbness or tingling only occasionally that it sometimes wakes him up with his arms underneath him.  CT scan 09/16/2021 showed some moderate left neural foraminal narrowing at C3-4 and DISH.  Review of Systems all the systems noncontributory to HPI.   Objective: Vital Signs: BP (!) 152/75   Pulse 69   Ht 5' 10"  (1.778 m)   Wt 184 lb (83.5 kg)   BMI 26.40 kg/m   Physical Exam Constitutional:      Appearance: He is well-developed.  HENT:     Head: Normocephalic and atraumatic.     Right Ear: External ear normal.     Left Ear: External ear normal.  Eyes:     Pupils: Pupils  are equal, round, and reactive to light.  Neck:     Thyroid: No thyromegaly.     Trachea: No tracheal deviation.  Cardiovascular:     Rate and Rhythm: Normal rate.  Pulmonary:     Effort: Pulmonary effort is normal.     Breath sounds: No wheezing.  Abdominal:     General: Bowel sounds are normal.     Palpations: Abdomen is soft.  Musculoskeletal:     Cervical back: Neck supple.  Skin:    General: Skin is warm and dry.     Capillary Refill: Capillary refill takes less than 2 seconds.  Neurological:     Mental Status: He is alert and oriented to person, place, and time.  Psychiatric:        Behavior: Behavior normal.        Thought Content: Thought content normal.        Judgment: Judgment normal.     Ortho Exam no myelopathic gait pattern no lower extremity hyperreflexia.  Upper extremity reflexes are 2+ and symmetrical.  He has 30% flexion and extension cervical spine.  No brachial plexus tenderness.  Good upper extremity strength.  Specialty Comments:  No  specialty comments available.  Imaging: No results found.   PMFS History: Patient Active Problem List   Diagnosis Date Noted   DISH (diffuse idiopathic skeletal hyperostosis) 12/01/2021   Multiple thyroid nodules 10/15/2021   Neuroforaminal stenosis of cervical spine 09/16/2021   Osteopenia of spine 08/13/2021   Screening for prostate cancer 08/13/2021   Chronic midline posterior neck pain 08/11/2021   Aortic stenosis, mild 06/02/2021   Encounter for general adult medical examination with abnormal findings 05/06/2021   Type II diabetes mellitus with manifestations ([place]) 05/06/2021   Primary hypertension 05/06/2021   Need for prophylactic vaccination with combined diphtheria-tetanus-pertussis (DTP) vaccine 05/06/2021   Abnormal electrocardiogram (ECG) (EKG) 05/06/2021   Hyperlipidemia LDL goal <70 05/06/2021   Ulcerative colitis ([place]) 05/06/2021   Past Medical History:  Diagnosis Date   Colitis    Diabetes  mellitus without complication ([place])    Hyperlipidemia    Hypertension    Ulcerative colitis ([place])     Family History  Problem Relation Age of Onset   Diabetes Mother    Arthritis Mother    Heart attack Mother    Heart disease Father    Heart attack Father    Diabetes Brother    Stomach cancer Neg Hx    Colon cancer Neg Hx    Esophageal cancer Neg Hx     Past Surgical History:  Procedure Laterality Date   HERNIA REPAIR     WISDOM TOOTH EXTRACTION     Social History   Occupational History   Occupation: Biomedical scientist  Tobacco Use   Smoking status: Never   Smokeless tobacco: Never  Vaping Use   Vaping Use: Never used  Substance and Sexual Activity   Alcohol use: Yes    Alcohol/week: 7.0 standard drinks of alcohol    Types: 7 Glasses of wine per week    Comment: occassionally   Drug use: Never   Sexual activity: Yes    Partners: Female

## 2021-12-09 DIAGNOSIS — H353231 Exudative age-related macular degeneration, bilateral, with active choroidal neovascularization: Secondary | ICD-10-CM | POA: Diagnosis not present

## 2022-01-28 ENCOUNTER — Telehealth: Payer: Self-pay | Admitting: Internal Medicine

## 2022-01-28 NOTE — Telephone Encounter (Signed)
Left message for patient to call back to schedule Medicare Annual Wellness Visit   No hx of AWV eligible as of 03/01/20  Please schedule at anytime with [facility] Advisor if patient calls the office back.     Any questions, please call me at [phone]

## 2022-02-04 ENCOUNTER — Telehealth: Payer: Self-pay | Admitting: Internal Medicine

## 2022-02-04 NOTE — Telephone Encounter (Signed)
LVM for pt to rtn my call to schedule AWV-I with [facility] call back # [phone]

## 2022-02-05 ENCOUNTER — Emergency Department (HOSPITAL_COMMUNITY)
Admission: EM | Admit: 2022-02-05 | Discharge: 2022-02-05 | Disposition: A | Discharge status: Home / Self Care | Payer: Worker's Compensation | Attending: Emergency Medicine | Admitting: Emergency Medicine

## 2022-02-05 ENCOUNTER — Other Ambulatory Visit: Payer: Self-pay

## 2022-02-05 ENCOUNTER — Telehealth: Payer: Self-pay | Admitting: Internal Medicine

## 2022-02-05 ENCOUNTER — Encounter (HOSPITAL_COMMUNITY): Payer: Self-pay

## 2022-02-05 DIAGNOSIS — Y92511 Restaurant or cafe as the place of occurrence of the external cause: Secondary | ICD-10-CM | POA: Insufficient documentation

## 2022-02-05 DIAGNOSIS — Z79899 Other long term (current) drug therapy: Secondary | ICD-10-CM | POA: Insufficient documentation

## 2022-02-05 DIAGNOSIS — Z23 Encounter for immunization: Secondary | ICD-10-CM | POA: Insufficient documentation

## 2022-02-05 DIAGNOSIS — Z7984 Long term (current) use of oral hypoglycemic drugs: Secondary | ICD-10-CM | POA: Insufficient documentation

## 2022-02-05 DIAGNOSIS — W260XXA Contact with knife, initial encounter: Secondary | ICD-10-CM | POA: Diagnosis not present

## 2022-02-05 DIAGNOSIS — I10 Essential (primary) hypertension: Secondary | ICD-10-CM | POA: Insufficient documentation

## 2022-02-05 DIAGNOSIS — E119 Type 2 diabetes mellitus without complications: Secondary | ICD-10-CM | POA: Insufficient documentation

## 2022-02-05 DIAGNOSIS — S6991XA Unspecified injury of right wrist, hand and finger(s), initial encounter: Secondary | ICD-10-CM | POA: Diagnosis present

## 2022-02-05 DIAGNOSIS — S61011A Laceration without foreign body of right thumb without damage to nail, initial encounter: Secondary | ICD-10-CM | POA: Insufficient documentation

## 2022-02-05 MED ORDER — TETANUS-DIPHTH-ACELL PERTUSSIS 5-2.5-18.5 LF-MCG/0.5 IM SUSY
0.5000 mL | PREFILLED_SYRINGE | Freq: Once | INTRAMUSCULAR | Status: AC
  Administered 2022-02-05: 0.5 mL via INTRAMUSCULAR
  Filled 2022-02-05: qty 0.5

## 2022-02-05 NOTE — ED Provider Notes (Signed)
[facility] DEPT Provider Note   CSN: [id number] Arrival date & time: 02/05/22  1125     History  Chief Complaint  Patient presents with   Laceration    Bruce Sanchez is a 66 y.o. male.   Laceration   66 year old male presents emergency department with complaints of thumb laceration.  Patient works as a Biomedical scientist at [employer] he was cutting iceberg lettuce when the knife slipped cutting his thumb.  Bleeding controlled with direct pressure.  Unaware of last tetanus and is requesting update.  Denies any weakness/sensory deficits infected thumb.  Past medical history significant for diabetes mellitus, hyperlipidemia, hypertension, ulcerative colitis  Home Medications Prior to Admission medications   Medication Sig Start Date End Date Taking? Authorizing Provider  amLODipine (NORVASC) 10 MG tablet Take 1 tablet (10 mg total) by mouth daily. 09/02/21 08/28/22  Minus Breeding, MD  dicyclomine (BENTYL) 10 MG capsule Take by mouth. 02/11/21 09/02/21  [provider]  hydrocortisone (ANUSOL-HC) 2.5 % rectal cream Place 1 Application rectally 2 (two) times daily as needed for hemorrhoids or anal itching. 11/24/21   Cirigliano, Vito V, DO  JARDIANCE 25 MG TABS tablet TAKE ONE TABLET BY MOUTH DAILY BEFORE BREAKFAST 11/29/21   Janith Lima, MD  metFORMIN (GLUCOPHAGE-XR) 500 MG 24 hr tablet Take 500 mg by mouth at bedtime.    [provider]  metoprolol succinate (TOPROL-XL) 50 MG 24 hr tablet Take 1 tablet (50 mg total) by mouth daily. Take with or immediately following a meal. 11/12/21   Janith Lima, MD  sulfaSALAzine (AZULFIDINE) 500 MG EC tablet Take 2 tablets (1,000 mg total) by mouth 4 (four) times daily. 11/11/21   Janith Lima, MD      Allergies    Trulicity [dulaglutide] and Olmesartan    Review of Systems   Review of Systems  All other systems reviewed and are negative.   Physical Exam Updated Vital Signs BP (!)  144/94   Pulse 66   Temp 98.1 F (36.7 C) (Oral)   Resp 16   Ht 5' 10.5" (1.791 m)   Wt 82.6 kg   SpO2 96%   BMI 25.75 kg/m  Physical Exam Vitals and nursing note reviewed.  Constitutional:      General: He is not in acute distress.    Appearance: He is well-developed.  HENT:     Head: Normocephalic and atraumatic.  Eyes:     Conjunctiva/sclera: Conjunctivae normal.  Cardiovascular:     Rate and Rhythm: Normal rate and regular rhythm.     Heart sounds: No murmur heard. Pulmonary:     Effort: Pulmonary effort is normal. No respiratory distress.     Breath sounds: Normal breath sounds.  Abdominal:     Palpations: Abdomen is soft.     Tenderness: There is no abdominal tenderness.  Musculoskeletal:        General: No swelling.     Cervical back: Neck supple.     Comments: 1.5 to 2 cm circular laceration noted to the distal aspect of patient's right palmar thumb.  No obvious nailbed involvement.  Area is very superficial with skin flap like appearance.  No active bleeding noted.  Patient complained no sensory deficits.  No obvious bony tenderness.  Patient has full active range of motion of thumb in flexion and extension, abduction, adduction.  Skin:    General: Skin is warm and dry.     Capillary Refill: Capillary refill takes less  than 2 seconds.  Neurological:     Mental Status: He is alert.  Psychiatric:        Mood and Affect: Mood normal.     ED Results / Procedures / Treatments   Labs (all labs ordered are listed, but only abnormal results are displayed) Labs Reviewed - No data to display  EKG None  Radiology No results found.  Procedures .Marland KitchenLaceration Repair  Date/Time: 02/05/2022 1:06 PM  Performed by: Wilnette Kales, PA Authorized by: Wilnette Kales, PA   Consent:    Consent obtained:  Verbal   Consent given by:  Patient   Risks, benefits, and alternatives were discussed: yes     Risks discussed:  Need for additional repair, nerve damage, poor  wound healing, poor cosmetic result, pain, infection, retained foreign body, tendon damage and vascular damage   Alternatives discussed:  No treatment, delayed treatment, observation and referral Universal protocol:    Procedure explained and questions answered to patient or proxy's satisfaction: yes     Patient identity confirmed:  Verbally with patient Anesthesia:    Anesthesia method:  None Laceration details:    Location:  Finger   Finger location:  R thumb   Length (cm):  2 Pre-procedure details:    Preparation:  Patient was prepped and draped in usual sterile fashion Exploration:    Limited defect created (wound extended): no     Hemostasis achieved with:  Direct pressure   Imaging outcome: foreign body not noted     Wound exploration: wound explored through full range of motion and entire depth of wound visualized     Contaminated: no   Treatment:    Area cleansed with:  Saline   Amount of cleaning:  Extensive   Irrigation solution:  Sterile saline   Irrigation volume:  300cc   Irrigation method:  Syringe   Visualized foreign bodies/material removed: no     Debridement:  None   Undermining:  None   Scar revision: no   Skin repair:    Repair method:  Tissue adhesive Approximation:    Approximation:  Close Repair type:    Repair type:  Simple Post-procedure details:    Dressing:  Splint for protection   Procedure completion:  Tolerated well, no immediate complications     Medications Ordered in ED Medications  Tdap (BOOSTRIX) injection 0.5 mL (0.5 mLs Intramuscular Given 02/05/22 1230)    ED Course/ Medical Decision Making/ A&P                           Medical Decision Making Risk Prescription drug management.   This patient presents to the ED for concern of laceration, this involves an extensive number of treatment options, and is a complaint that carries with it a high risk of complications and morbidity.  The differential diagnosis includes fracture,  strain/sprain, dislocation, ligamentous/tendinous injury, neurovascular compromise   Co morbidities that complicate the patient evaluation  See HPI   Additional history obtained:  Additional history obtained from EMR External records from outside source obtained and reviewed including hospital records   Lab Tests:  N/a   Imaging Studies ordered:  N/a   Cardiac Monitoring: / EKG:  The patient was maintained on a cardiac monitor.  I personally viewed and interpreted the cardiac monitored which showed an underlying rhythm of: Sinus rhythm   Consultations Obtained:  N/a   Problem List / ED Course / Critical interventions / Medication management  Laceration I ordered medication including Dermabond f  Reevaluation of the patient after these medicines showed that the patient improved I have reviewed the patients home medicines and have made adjustments as needed   Social Determinants of Health:  Denies tobacco, licit drug use   Test / Admission - Considered:  Laceration Vitals signs significant for mild hypertension with a blood pressure 144/94.  Recommend follow-up with PCP regarding elevation blood pressure.. Otherwise within normal range and stable throughout visit. Patient's laceration repaired in manner as depicted above.  No evidence of acute fracture/bony abnormality/ligamentous/tendinous injury on physical exam so further imaging/workup needed necessary at this time.  Patient educated at length regarding proper wound care following closure.  Recommend reevaluation by primary care in 7 days.  Treatment plan discussed with patient and he would monitor nursing was agreeable to said plan. Worrisome signs and symptoms were discussed with the patient, and the patient acknowledged understanding to return to the ED if noticed. Patient was stable upon discharge.          Final Clinical Impression(s) / ED Diagnoses Final diagnoses:  None    Rx / DC Orders ED  Discharge Orders     None         Wilnette Kales, [place] 02/05/22 1533    Lacretia Leigh, MD 02/08/22 1410

## 2022-02-05 NOTE — Discharge Instructions (Signed)
Wash area gently with warm soapy water.  Avoid picking.  Use thumb splint as needed for added protection.  Glue should remain intact until laceration heals.  Recommend evaluation by primary care in 7 to 10 days.  Please not hesitate to return to emergency department for worrisome signs and symptoms we discussed become apparent.

## 2022-02-05 NOTE — Telephone Encounter (Signed)
LVM for pt to rtn my call to schedule AWV-I with [facility] call back# [phone]

## 2022-02-05 NOTE — ED Triage Notes (Signed)
Patient reports that he cut his right thumb with a french knife. Bleeding controlled.

## 2022-02-08 DIAGNOSIS — E119 Type 2 diabetes mellitus without complications: Secondary | ICD-10-CM | POA: Diagnosis not present

## 2022-02-10 ENCOUNTER — Encounter: Payer: Self-pay | Admitting: Internal Medicine

## 2022-02-10 ENCOUNTER — Ambulatory Visit (INDEPENDENT_AMBULATORY_CARE_PROVIDER_SITE_OTHER): Payer: Medicare Other | Admitting: Internal Medicine

## 2022-02-10 VITALS — BP 132/72 | HR 81 | Temp 98.1°F | Resp 16 | Ht 70.0 in | Wt 188.0 lb

## 2022-02-10 DIAGNOSIS — E042 Nontoxic multinodular goiter: Secondary | ICD-10-CM

## 2022-02-10 DIAGNOSIS — I10 Essential (primary) hypertension: Secondary | ICD-10-CM

## 2022-02-10 DIAGNOSIS — E118 Type 2 diabetes mellitus with unspecified complications: Secondary | ICD-10-CM

## 2022-02-10 LAB — TSH: TSH: 1.32 u[IU]/mL (ref 0.35–5.50)

## 2022-02-10 LAB — HEMOGLOBIN A1C: Hgb A1c MFr Bld: 5.5 % (ref 4.6–6.5)

## 2022-02-10 NOTE — Progress Notes (Signed)
Subjective:  Patient ID: Bruce Sanchez, male    DOB: 09-07-1955  Age: 66 y.o. MRN: [id number]  CC: Hypertension   HPI Bruce Sanchez presents for f/up -  The right thyromegaly is improving and he has no discomfort in the region.  He denies frontal neck pain or trouble swallowing.  He continues to have nonradiating posterior neck pain.  Outpatient Medications Prior to Visit  Medication Sig Dispense Refill   amLODipine (NORVASC) 10 MG tablet Take 1 tablet (10 mg total) by mouth daily. 90 tablet 3   hydrocortisone (ANUSOL-HC) 2.5 % rectal cream Place 1 Application rectally 2 (two) times daily as needed for hemorrhoids or anal itching. 30 g 3   JARDIANCE 25 MG TABS tablet TAKE ONE TABLET BY MOUTH DAILY BEFORE BREAKFAST 90 tablet 1   metFORMIN (GLUCOPHAGE-XR) 500 MG 24 hr tablet Take 500 mg by mouth at bedtime.     metoprolol succinate (TOPROL-XL) 50 MG 24 hr tablet Take 1 tablet (50 mg total) by mouth daily. Take with or immediately following a meal. 90 tablet 1   sulfaSALAzine (AZULFIDINE) 500 MG EC tablet Take 2 tablets (1,000 mg total) by mouth 4 (four) times daily. 720 tablet 1   dicyclomine (BENTYL) 10 MG capsule Take by mouth.     No facility-administered medications prior to visit.    ROS Review of Systems  Constitutional: Negative.  Negative for diaphoresis, fatigue and unexpected weight change.  HENT: Negative.  Negative for trouble swallowing.   Eyes: Negative.   Respiratory: Negative.  Negative for cough, chest tightness, shortness of breath and wheezing.   Cardiovascular:  Negative for chest pain, palpitations and leg swelling.  Gastrointestinal:  Negative for abdominal pain, diarrhea, nausea and vomiting.  Endocrine: Negative.   Genitourinary: Negative.  Negative for difficulty urinating.  Musculoskeletal: Negative.   Skin: Negative.   Neurological:  Negative for dizziness, weakness, light-headedness and numbness.  Hematological:  Negative for adenopathy. Does not  bruise/bleed easily.  Psychiatric/Behavioral: Negative.      Objective:  BP 132/72 (BP Location: Left Arm, Patient Position: Sitting, Cuff Size: Large)   Pulse 81   Temp 98.1 F (36.7 C) (Oral)   Resp 16   Ht 5' 10"  (1.778 m)   Wt 188 lb (85.3 kg)   SpO2 94%   BMI 26.98 kg/m   BP Readings from Last 3 Encounters:  02/10/22 132/72  02/05/22 (!) 144/94  12/01/21 (!) 152/75    Wt Readings from Last 3 Encounters:  02/10/22 188 lb (85.3 kg)  02/05/22 182 lb (82.6 kg)  12/01/21 184 lb (83.5 kg)    Physical Exam Vitals reviewed.  HENT:     Nose: Nose normal.     Mouth/Throat:     Mouth: Mucous membranes are moist.  Eyes:     General: No scleral icterus.    Conjunctiva/sclera: Conjunctivae normal.  Neck:     Thyroid: Thyromegaly present. No thyroid mass or thyroid tenderness.     Comments: Right thyromegaly has diminished by about 50%. Cardiovascular:     Rate and Rhythm: Normal rate and regular rhythm.     Heart sounds: No murmur heard. Pulmonary:     Effort: Pulmonary effort is normal.     Breath sounds: No stridor. No wheezing, rhonchi or rales.  Abdominal:     General: Abdomen is flat.     Palpations: There is no mass.     Tenderness: There is no abdominal tenderness. There is no guarding.     Hernia:  No hernia is present.  Musculoskeletal:        General: Normal range of motion.     Cervical back: Neck supple.     Right lower leg: No edema.     Left lower leg: No edema.  Lymphadenopathy:     Cervical: No cervical adenopathy.  Skin:    General: Skin is warm and dry.  Neurological:     General: No focal deficit present.     Mental Status: He is alert.  Psychiatric:        Mood and Affect: Mood normal.        Behavior: Behavior normal.     Lab Results  Component Value Date   WBC 5.4 11/11/2021   HGB 15.5 11/11/2021   HCT 46.5 11/11/2021   PLT 184.0 11/11/2021   GLUCOSE 220 (H) 11/11/2021   CHOL 111 08/11/2021   TRIG 90.0 08/11/2021   HDL 44.50  08/11/2021   LDLCALC 48 08/11/2021   ALT 19 11/11/2021   AST 20 11/11/2021   NA 143 11/11/2021   K 3.7 11/11/2021   CL 105 11/11/2021   CREATININE 0.67 11/11/2021   BUN 14 11/11/2021   CO2 28 11/11/2021   TSH 1.32 02/10/2022   PSA 0.95 08/11/2021   HGBA1C 5.5 02/10/2022   MICROALBUR 15.8 (H) 08/11/2021    No results found.  Assessment & Plan:   Bruce Sanchez was seen today for hypertension.  Diagnoses and all orders for this visit:  Multiple thyroid nodules- Improvement noted and he is euthyroid. -     TSH; Future -     TSH  Primary hypertension- His blood pressure is very well-controlled. -     TSH; Future -     TSH  Type II diabetes mellitus with manifestations ([place])- His blood sugar is very well-controlled. -     Hemoglobin A1c; Future -     Hemoglobin A1c   I am having Bruce Sanchez maintain his dicyclomine, amLODipine, sulfaSALAzine, metoprolol succinate, metFORMIN, hydrocortisone, and Jardiance.  No orders of the defined types were placed in this encounter.    Follow-up: Return in about 6 months (around 08/12/2022).  Scarlette Calico, MD

## 2022-02-10 NOTE — Patient Instructions (Signed)
Hypertension, Adult High blood pressure (hypertension) is when the force of blood pumping through the arteries is too strong. The arteries are the blood vessels that carry blood from the heart throughout the body. Hypertension forces the heart to work harder to pump blood and may cause arteries to become narrow or stiff. Untreated or uncontrolled hypertension can lead to a heart attack, heart failure, a stroke, kidney disease, and other problems. A blood pressure reading consists of a higher number over a lower number. Ideally, your blood pressure should be below 120/80. The first ("top") number is called the systolic pressure. It is a measure of the pressure in your arteries as your heart beats. The second ("bottom") number is called the diastolic pressure. It is a measure of the pressure in your arteries as the heart relaxes. What are the causes? The exact cause of this condition is not known. There are some conditions that result in high blood pressure. What increases the risk? Certain factors may make you more likely to develop high blood pressure. Some of these risk factors are under your control, including: Smoking. Not getting enough exercise or physical activity. Being overweight. Having too much fat, sugar, calories, or salt (sodium) in your diet. Drinking too much alcohol. Other risk factors include: Having a personal history of heart disease, diabetes, high cholesterol, or kidney disease. Stress. Having a family history of high blood pressure and high cholesterol. Having obstructive sleep apnea. Age. The risk increases with age. What are the signs or symptoms? High blood pressure may not cause symptoms. Very high blood pressure (hypertensive crisis) may cause: Headache. Fast or irregular heartbeats (palpitations). Shortness of breath. Nosebleed. Nausea and vomiting. Vision changes. Severe chest pain, dizziness, and seizures. How is this diagnosed? This condition is diagnosed by  measuring your blood pressure while you are seated, with your arm resting on a flat surface, your legs uncrossed, and your feet flat on the floor. The cuff of the blood pressure monitor will be placed directly against the skin of your upper arm at the level of your heart. Blood pressure should be measured at least twice using the same arm. Certain conditions can cause a difference in blood pressure between your right and left arms. If you have a high blood pressure reading during one visit or you have normal blood pressure with other risk factors, you may be asked to: Return on a different day to have your blood pressure checked again. Monitor your blood pressure at home for 1 week or longer. If you are diagnosed with hypertension, you may have other blood or imaging tests to help your health care provider understand your overall risk for other conditions. How is this treated? This condition is treated by making healthy lifestyle changes, such as eating healthy foods, exercising more, and reducing your alcohol intake. You may be referred for counseling on a healthy diet and physical activity. Your health care provider may prescribe medicine if lifestyle changes are not enough to get your blood pressure under control and if: Your systolic blood pressure is above 130. Your diastolic blood pressure is above 80. Your personal target blood pressure may vary depending on your medical conditions, your age, and other factors. Follow these instructions at home: Eating and drinking  Eat a diet that is high in fiber and potassium, and low in sodium, added sugar, and fat. An example of this eating plan is called the DASH diet. DASH stands for Dietary Approaches to Stop Hypertension. To eat this way: Eat   plenty of fresh fruits and vegetables. Try to fill one half of your plate at each meal with fruits and vegetables. Eat whole grains, such as whole-wheat pasta, brown rice, or whole-grain bread. Fill about one  fourth of your plate with whole grains. Eat or drink low-fat dairy products, such as skim milk or low-fat yogurt. Avoid fatty cuts of meat, processed or cured meats, and poultry with skin. Fill about one fourth of your plate with lean proteins, such as fish, chicken without skin, beans, eggs, or tofu. Avoid pre-made and processed foods. These tend to be higher in sodium, added sugar, and fat. Reduce your daily sodium intake. Many people with hypertension should eat less than 1,500 mg of sodium a day. Do not drink alcohol if: Your health care provider tells you not to drink. You are pregnant, may be pregnant, or are planning to become pregnant. If you drink alcohol: Limit how much you have to: 0-1 drink a day for women. 0-2 drinks a day for men. Know how much alcohol is in your drink. In the [place] one drink equals one 12 oz bottle of beer (355 mL), one 5 oz glass of wine (148 mL), or one 1 oz glass of hard liquor (44 mL). Lifestyle  Work with your health care provider to maintain a healthy body weight or to lose weight. Ask what an ideal weight is for you. Get at least 30 minutes of exercise that causes your heart to beat faster (aerobic exercise) most days of the week. Activities may include walking, swimming, or biking. Include exercise to strengthen your muscles (resistance exercise), such as Pilates or lifting weights, as part of your weekly exercise routine. Try to do these types of exercises for 30 minutes at least 3 days a week. Do not use any products that contain nicotine or tobacco. These products include cigarettes, chewing tobacco, and vaping devices, such as e-cigarettes. If you need help quitting, ask your health care provider. Monitor your blood pressure at home as told by your health care provider. Keep all follow-up visits. This is important. Medicines Take over-the-counter and prescription medicines only as told by your health care provider. Follow directions carefully. Blood  pressure medicines must be taken as prescribed. Do not skip doses of blood pressure medicine. Doing this puts you at risk for problems and can make the medicine less effective. Ask your health care provider about side effects or reactions to medicines that you should watch for. Contact a health care provider if you: Think you are having a reaction to a medicine you are taking. Have headaches that keep coming back (recurring). Feel dizzy. Have swelling in your ankles. Have trouble with your vision. Get help right away if you: Develop a severe headache or confusion. Have unusual weakness or numbness. Feel faint. Have severe pain in your chest or abdomen. Vomit repeatedly. Have trouble breathing. These symptoms may be an emergency. Get help right away. Call 911. Do not wait to see if the symptoms will go away. Do not drive yourself to the hospital. Summary Hypertension is when the force of blood pumping through your arteries is too strong. If this condition is not controlled, it may put you at risk for serious complications. Your personal target blood pressure may vary depending on your medical conditions, your age, and other factors. For most people, a normal blood pressure is less than 120/80. Hypertension is treated with lifestyle changes, medicines, or a combination of both. Lifestyle changes include losing weight, eating a healthy,   low-sodium diet, exercising more, and limiting alcohol. This information is not intended to replace advice given to you by your health care provider. Make sure you discuss any questions you have with your health care provider. Document Revised: 12/23/2020 Document Reviewed: 12/23/2020 Elsevier Patient Education  2023 [employer]  

## 2022-02-17 DIAGNOSIS — H353231 Exudative age-related macular degeneration, bilateral, with active choroidal neovascularization: Secondary | ICD-10-CM | POA: Diagnosis not present

## 2022-02-24 ENCOUNTER — Ambulatory Visit: Payer: Medicare Other

## 2022-03-09 DIAGNOSIS — E042 Nontoxic multinodular goiter: Secondary | ICD-10-CM | POA: Diagnosis not present

## 2022-03-30 ENCOUNTER — Ambulatory Visit: Payer: Medicare Other

## 2022-03-31 ENCOUNTER — Ambulatory Visit (INDEPENDENT_AMBULATORY_CARE_PROVIDER_SITE_OTHER): Payer: Medicare Other

## 2022-03-31 VITALS — BP 126/60 | HR 76 | Temp 97.6°F | Resp 16 | Ht 70.0 in | Wt 188.8 lb

## 2022-03-31 DIAGNOSIS — Z0000 Encounter for general adult medical examination without abnormal findings: Secondary | ICD-10-CM | POA: Diagnosis not present

## 2022-03-31 NOTE — Patient Instructions (Addendum)
Bruce Sanchez , Thank you for taking time to come for your Medicare Wellness Visit. I appreciate your ongoing commitment to your health goals. Please review the following plan we discussed and let me know if I can assist you in the future.   These are the goals we discussed:  Goals      Client will verbalize knowledge of diabetes self-management as evidenced by Hgb A1C <7 or as defined by provider.            This is a list of the screening recommended for you and due dates:  Health Maintenance  Topic Date Due   Hepatitis C Screening: USPSTF Recommendation to screen - Ages 69-79 yo.  Never done   Flu Shot  05/30/2022*   Complete foot exam   05/07/2022   Eye exam for diabetics  07/09/2022   Yearly kidney health urinalysis for diabetes  08/12/2022   Hemoglobin A1C  08/12/2022   Yearly kidney function blood test for diabetes  11/12/2022   Medicare Annual Wellness Visit  04/01/2023   Colon Cancer Screening  09/18/2030   DTaP/Tdap/Td vaccine (3 - Td or Tdap) 02/06/2032   Pneumonia Vaccine  Completed   Zoster (Shingles) Vaccine  Completed   HPV Vaccine  Aged Out   COVID-19 Vaccine  Discontinued  *Topic was postponed. The date shown is not the original due date.    Advanced directives: No  Conditions/risks identified: Yes; Diet controlled Diabetes Mellitus II  Next appointment: Follow up in one year for your annual wellness visit.   Preventive Care 69 Years and Older, Male  Preventive care refers to lifestyle choices and visits with your health care provider that can promote health and wellness. What does preventive care include? A yearly physical exam. This is also called an annual well check. Dental exams once or twice a year. Routine eye exams. Ask your health care provider how often you should have your eyes checked. Personal lifestyle choices, including: Daily care of your teeth and gums. Regular physical activity. Eating a healthy diet. Avoiding tobacco and drug  use. Limiting alcohol use. Practicing safe sex. Taking low doses of aspirin every day. Taking vitamin and mineral supplements as recommended by your health care provider. What happens during an annual well check? The services and screenings done by your health care provider during your annual well check will depend on your age, overall health, lifestyle risk factors, and family history of disease. Counseling  Your health care provider may ask you questions about your: Alcohol use. Tobacco use. Drug use. Emotional well-being. Home and relationship well-being. Sexual activity. Eating habits. History of falls. Memory and ability to understand (cognition). Work and work Statistician. Screening  You may have the following tests or measurements: Height, weight, and BMI. Blood pressure. Lipid and cholesterol levels. These may be checked every 5 years, or more frequently if you are over 69 years old. Skin check. Lung cancer screening. You may have this screening every year starting at age 69 if you have a 30-pack-year history of smoking and currently smoke or have quit within the past 15 years. Fecal occult blood test (FOBT) of the stool. You may have this test every year starting at age 69. Flexible sigmoidoscopy or colonoscopy. You may have a sigmoidoscopy every 5 years or a colonoscopy every 10 years starting at age 69. Prostate cancer screening. Recommendations will vary depending on your family history and other risks. Hepatitis C blood test. Hepatitis B blood test. Sexually transmitted disease (STD) testing.  Diabetes screening. This is done by checking your blood sugar (glucose) after you have not eaten for a while (fasting). You may have this done every 1-3 years. Abdominal aortic aneurysm (AAA) screening. You may need this if you are a current or former smoker. Osteoporosis. You may be screened starting at age 69 if you are at high risk. Talk with your health care provider about  your test results, treatment options, and if necessary, the need for more tests. Vaccines  Your health care provider may recommend certain vaccines, such as: Influenza vaccine. This is recommended every year. Tetanus, diphtheria, and acellular pertussis (Tdap, Td) vaccine. You may need a Td booster every 10 years. Zoster vaccine. You may need this after age 69. Pneumococcal 13-valent conjugate (PCV13) vaccine. One dose is recommended after age 69. Pneumococcal polysaccharide (PPSV23) vaccine. One dose is recommended after age 69. Talk to your health care provider about which screenings and vaccines you need and how often you need them. This information is not intended to replace advice given to you by your health care provider. Make sure you discuss any questions you have with your health care provider. Document Released: 03/14/2015 Document Revised: 11/05/2015 Document Reviewed: 12/17/2014 Elsevier Interactive Patient Education  2017 [place] Prevention in the Home Falls can cause injuries. They can happen to people of all ages. There are many things you can do to make your home safe and to help prevent falls. What can I do on the outside of my home? Regularly fix the edges of walkways and driveways and fix any cracks. Remove anything that might make you trip as you walk through a door, such as a raised step or threshold. Trim any bushes or trees on the path to your home. Use bright outdoor lighting. Clear any walking paths of anything that might make someone trip, such as rocks or tools. Regularly check to see if handrails are loose or broken. Make sure that both sides of any steps have handrails. Any raised decks and porches should have guardrails on the edges. Have any leaves, snow, or ice cleared regularly. Use sand or salt on walking paths during winter. Clean up any spills in your garage right away. This includes oil or grease spills. What can I do in the bathroom? Use  night lights. Install grab bars by the toilet and in the tub and shower. Do not use towel bars as grab bars. Use non-skid mats or decals in the tub or shower. If you need to sit down in the shower, use a plastic, non-slip stool. Keep the floor dry. Clean up any water that spills on the floor as soon as it happens. Remove soap buildup in the tub or shower regularly. Attach bath mats securely with double-sided non-slip rug tape. Do not have throw rugs and other things on the floor that can make you trip. What can I do in the bedroom? Use night lights. Make sure that you have a light by your bed that is easy to reach. Do not use any sheets or blankets that are too big for your bed. They should not hang down onto the floor. Have a firm chair that has side arms. You can use this for support while you get dressed. Do not have throw rugs and other things on the floor that can make you trip. What can I do in the kitchen? Clean up any spills right away. Avoid walking on wet floors. Keep items that you use a lot in easy-to-reach  places. If you need to reach something above you, use a strong step stool that has a grab bar. Keep electrical cords out of the way. Do not use floor polish or wax that makes floors slippery. If you must use wax, use non-skid floor wax. Do not have throw rugs and other things on the floor that can make you trip. What can I do with my stairs? Do not leave any items on the stairs. Make sure that there are handrails on both sides of the stairs and use them. Fix handrails that are broken or loose. Make sure that handrails are as long as the stairways. Check any carpeting to make sure that it is firmly attached to the stairs. Fix any carpet that is loose or worn. Avoid having throw rugs at the top or bottom of the stairs. If you do have throw rugs, attach them to the floor with carpet tape. Make sure that you have a light switch at the top of the stairs and the bottom of the  stairs. If you do not have them, ask someone to add them for you. What else can I do to help prevent falls? Wear shoes that: Do not have high heels. Have rubber bottoms. Are comfortable and fit you well. Are closed at the toe. Do not wear sandals. If you use a stepladder: Make sure that it is fully opened. Do not climb a closed stepladder. Make sure that both sides of the stepladder are locked into place. Ask someone to hold it for you, if possible. Clearly mark and make sure that you can see: Any grab bars or handrails. First and last steps. Where the edge of each step is. Use tools that help you move around (mobility aids) if they are needed. These include: Canes. Walkers. Scooters. Crutches. Turn on the lights when you go into a dark area. Replace any light bulbs as soon as they burn out. Set up your furniture so you have a clear path. Avoid moving your furniture around. If any of your floors are uneven, fix them. If there are any pets around you, be aware of where they are. Review your medicines with your doctor. Some medicines can make you feel dizzy. This can increase your chance of falling. Ask your doctor what other things that you can do to help prevent falls. This information is not intended to replace advice given to you by your health care provider. Make sure you discuss any questions you have with your health care provider. Document Released: 12/12/2008 Document Revised: 07/24/2015 Document Reviewed: 03/22/2014 Elsevier Interactive Patient Education  2017 [employer].

## 2022-03-31 NOTE — Progress Notes (Signed)
Subjective:   Bruce Sanchez is a 67 y.o. male who presents for an Initial Medicare Annual Wellness Visit.  Review of Systems     Cardiac Risk Factors include: advanced age (>32men, >29 women);hypertension;male gender;family history of premature cardiovascular disease;dyslipidemia     Objective:    Today's Vitals   03/31/22 1440  BP: 126/60  Pulse: 76  Resp: 16  Temp: 97.6 F (36.4 C)  TempSrc: Temporal  SpO2: 97%  Weight: 188 lb 12.8 oz (85.6 kg)  Height: 5\' 10"  (1.778 m)  PainSc: 0-No pain   Body mass index is 27.09 kg/m.     02/05/2022   11:38 AM  Advanced Directives  Does Patient Have a Medical Advance Directive? No  Would patient like information on creating a medical advance directive? No - Patient declined    Current Medications (verified) Outpatient Encounter Medications as of 03/31/2022  Medication Sig   amLODipine (NORVASC) 10 MG tablet Take 1 tablet (10 mg total) by mouth daily.   dicyclomine (BENTYL) 10 MG capsule Take by mouth.   hydrocortisone (ANUSOL-HC) 2.5 % rectal cream Place 1 Application rectally 2 (two) times daily as needed for hemorrhoids or anal itching.   JARDIANCE 25 MG TABS tablet TAKE ONE TABLET BY MOUTH DAILY BEFORE BREAKFAST   metFORMIN (GLUCOPHAGE-XR) 500 MG 24 hr tablet Take 500 mg by mouth at bedtime.   metoprolol succinate (TOPROL-XL) 50 MG 24 hr tablet Take 1 tablet (50 mg total) by mouth daily. Take with or immediately following a meal.   sulfaSALAzine (AZULFIDINE) 500 MG EC tablet Take 2 tablets (1,000 mg total) by mouth 4 (four) times daily.   No facility-administered encounter medications on file as of 03/31/2022.    Allergies (verified) Trulicity [dulaglutide] and Olmesartan   History: Past Medical History:  Diagnosis Date   Colitis    Diabetes mellitus without complication (HCC)    Hyperlipidemia    Hypertension    Ulcerative colitis (HCC)    Past Surgical History:  Procedure Laterality Date   HERNIA REPAIR      WISDOM TOOTH EXTRACTION     Family History  Problem Relation Age of Onset   Diabetes Mother    Arthritis Mother    Heart attack Mother    Heart disease Father    Heart attack Father    Diabetes Brother    Stomach cancer Neg Hx    Colon cancer Neg Hx    Esophageal cancer Neg Hx    Social History   Socioeconomic History   Marital status: Married    Spouse name: Not on file   Number of children: Not on file   Years of education: Not on file   Highest education level: Not on file  Occupational History   Occupation: chef  Tobacco Use   Smoking status: Never   Smokeless tobacco: Never  Vaping Use   Vaping Use: Never used  Substance and Sexual Activity   Alcohol use: Yes    Alcohol/week: 7.0 standard drinks of alcohol    Types: 7 Glasses of wine per week    Comment: occassionally   Drug use: Never   Sexual activity: Yes    Partners: Female  Other Topics Concern   Not on file  Social History Narrative   Not on file   Social Determinants of Health   Financial Resource Strain: Low Risk  (03/31/2022)   Overall Financial Resource Strain (CARDIA)    Difficulty of Paying Living Expenses: Not hard at all  Food  Insecurity: No Food Insecurity (03/31/2022)   Hunger Vital Sign    Worried About Running Out of Food in the Last Year: Never true    Ran Out of Food in the Last Year: Never true  Transportation Needs: No Transportation Needs (03/31/2022)   PRAPARE - Hydrologist (Medical): No    Lack of Transportation (Non-Medical): No  Physical Activity: Sufficiently Active (03/31/2022)   Exercise Vital Sign    Days of Exercise per Week: 7 days    Minutes of Exercise per Session: 60 min  Stress: No Stress Concern Present (03/31/2022)   [place]    Feeling of Stress : Not at all  Social Connections: [place] (03/31/2022)   Social Connection and Isolation Panel [NHANES]     Frequency of Communication with Friends and Family: More than three times a week    Frequency of Social Gatherings with Friends and Family: More than three times a week    Attends Religious Services: More than 4 times per year    Active Member of [employer] or Organizations: Yes    Attends Music therapist: More than 4 times per year    Marital Status: Married    Tobacco Counseling Counseling given: Not Answered   Clinical Intake:  Pre-visit preparation completed: Yes  Pain : No/denies pain Pain Score: 0-No pain     BMI - recorded: 27.09 Nutritional Risks: None Diabetes: Yes CBG done?: No Did pt. 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  No  Does the patient have any non-healing wounds?  No  Has the patient had any unintentional weight loss or weight gain?  No   Diabetes:  Is the patient diabetic?  Yes  If diabetic, was a CBG obtained today?  No  Did the patient bring in their glucometer from home?  No  How often do you monitor your CBG's? No.   Financial Strains and Diabetes Management:  Are you having any financial strains with the device, your supplies or your medication? No .  Does the patient want to be seen by Chronic Care Management for management of their diabetes?  No  Would the patient like to be referred to a Nutritionist or for Diabetic Management?  No   Diabetic Exams:  Diabetic Eye Exam: Completed 07/08/2021 Diabetic Foot Exam: Completed 05/06/2021   Interpreter Needed?: No  Information entered by :: Lisette Abu, LPN.   Activities of Daily Living    03/31/2022    3:25 PM  In your present state of health, do you have any difficulty performing  the following activities:  Hearing? 0  Vision? 0  Difficulty concentrating or making decisions? 0  Walking or climbing stairs? 0  Dressing or bathing? 0  Doing errands, shopping? 0  Preparing Food and eating ? N  Using the Toilet? N  In the past six months, have you accidently leaked urine? N  Do you have problems with loss of bowel control? N  Managing your Medications? N  Managing your Finances? N  Housekeeping or managing your Housekeeping? N    Patient Care Team: Janith Lima, MD  as PCP - General (Internal Medicine) Minus Breeding, MD as PCP - [facility] as Consulting Physician (Ophthalmology) Stacie Glaze, Jacqualyn Posey, MD as Consulting Physician (Ophthalmology)  Indicate any recent Medical Services you may have received from other than [facility] providers in the past year (date may be approximate).     Assessment:   This is a routine wellness examination for [place].  Hearing/Vision screen Hearing Screening - Comments:: Denies hearing difficulties   Vision Screening - Comments:: Wears rx glasses - up to date with routine eye exams with [facility] (Dr. Dennie Fetters)   Dietary issues and exercise activities discussed: Current Exercise Habits: The patient has a physically strenuous job, but has no regular exercise apart from work., Exercise limited by: None identified   Goals Addressed             This Visit's Progress    Client will verbalize knowledge of diabetes self-management as evidenced by Hgb A1C <7 or as defined by provider.            Depression Screen    03/31/2022    3:21 PM 05/06/2021    9:32 AM  PHQ 2/9 Scores  PHQ - 2 Score 0 0    Fall Risk    03/31/2022    3:25 PM  [place] in the past year? 0  Number falls in past yr: 0  Injury with Fall? 0  Risk for fall due to : No Fall Risks  Follow up Falls prevention discussed    [place]:  Any stairs in or around  the home? No  If so, are there any without handrails? No  Home free of loose throw rugs in walkways, pet beds, electrical cords, etc? Yes  Adequate lighting in your home to reduce risk of falls? Yes   ASSISTIVE DEVICES UTILIZED TO PREVENT FALLS:  Life alert? No  Use of a cane, walker or w/c? No  Grab bars in the bathroom? No  Shower chair or bench in shower? No  Elevated toilet seat or a handicapped toilet? No   TIMED UP AND GO:  Was the test performed? Yes .  Length of time to ambulate 10 feet: 8 sec.   Gait steady and fast without use of assistive device  Cognitive Function:        03/31/2022    2:46 PM  6CIT Screen  What Year? 0 points  What month? 0 points  What time? 0 points  Count back from 20 0 points  Months in reverse 0 points  Repeat phrase 0 points  Total Score 0 points    Immunizations Immunization History  Administered Date(s) Administered   PFIZER(Purple Top)SARS-COV-2 Vaccination 05/02/2019, 05/30/2019   PNEUMOCOCCAL CONJUGATE-20 08/01/2020   Tdap 02/05/2022, 02/05/2022   Zoster Recombinat (Shingrix) 12/24/2020, 05/26/2021    TDAP status: Up to date  Flu Vaccine status: Declined, Education has been provided regarding the importance of this vaccine but patient still declined. Advised may receive this vaccine at local pharmacy or Health Dept. Aware to provide a copy of the vaccination record if obtained from local pharmacy or Health Dept. Verbalized acceptance and understanding.  Pneumococcal vaccine status: Up to date  Covid-19 vaccine status: Declined, Education has been provided regarding the importance of this vaccine but patient still declined. Advised may receive this vaccine at local pharmacy or Health Dept.or vaccine clinic. Aware to provide a copy of the vaccination record if obtained from  local pharmacy or Health Dept. Verbalized acceptance and understanding.  Qualifies for Shingles Vaccine? Yes   Zostavax completed No   Shingrix  Completed?: No.    Education has been provided regarding the importance of this vaccine. Patient has been advised to call insurance company to determine out of pocket expense if they have not yet received this vaccine. Advised may also receive vaccine at local pharmacy or Health Dept. Verbalized acceptance and understanding.  Screening Tests Health Maintenance  Topic Date Due   Hepatitis C Screening  Never done   INFLUENZA VACCINE  05/30/2022 (Originally 09/29/2021)   FOOT EXAM  05/07/2022   OPHTHALMOLOGY EXAM  07/09/2022   Diabetic kidney evaluation - Urine ACR  08/12/2022   HEMOGLOBIN A1C  08/12/2022   Diabetic kidney evaluation - eGFR measurement  11/12/2022   Medicare Annual Wellness (AWV)  04/01/2023   COLONOSCOPY (Pts 45-72yrs Insurance coverage will need to be confirmed)  09/18/2030   DTaP/Tdap/Td (3 - Td or Tdap) 02/06/2032   Pneumonia Vaccine 35+ Years old  Completed   Zoster Vaccines- Shingrix  Completed   HPV VACCINES  Aged Out   COVID-19 Vaccine  Discontinued    Health Maintenance  Health Maintenance Due  Topic Date Due   Hepatitis C Screening  Never done    Colorectal cancer screening: Type of screening: Colonoscopy. Completed 09/17/2020. Repeat every 3-5 years  Lung Cancer Screening: (Low Dose CT Chest recommended if Age 56-80 years, 30 pack-year currently smoking OR have quit w/in 15years.) does not qualify.   Lung Cancer Screening Referral: no  Additional Screening:  Hepatitis C Screening: does qualify; Completed no  Vision Screening: Recommended annual ophthalmology exams for early detection of glaucoma and other disorders of the eye. Is the patient up to date with their annual eye exam?  Yes  Who is the provider or what is the name of the office in which the patient attends annual eye exams? Dennie Fetters, MD. If pt is not established with a provider, would they like to be referred to a provider to establish care? No .   Dental Screening: Recommended annual  dental exams for proper oral hygiene  Community Resource Referral / Chronic Care Management: CRR required this visit?  No   CCM required this visit?  No      Plan:     I have personally reviewed and noted the following in the patient's chart:   Medical and social history Use of alcohol, tobacco or illicit drugs  Current medications and supplements including opioid prescriptions. Patient is not currently taking opioid prescriptions. Functional ability and status Nutritional status Physical activity Advanced directives List of other physicians Hospitalizations, surgeries, and ER visits in previous 12 months Vitals Screenings to include cognitive, depression, and falls Referrals and appointments  In addition, I have reviewed and discussed with patient certain preventive protocols, quality metrics, and best practice recommendations. A written personalized care plan for preventive services as well as general preventive health recommendations were provided to patient.     Sheral Flow, LPN   03/01/7508   Nurse Notes: Normal cognitive status assessed by direct observation by this Nurse Health Advisor. No abnormalities found.

## 2022-05-12 DIAGNOSIS — H353231 Exudative age-related macular degeneration, bilateral, with active choroidal neovascularization: Secondary | ICD-10-CM | POA: Diagnosis not present

## 2022-05-17 DIAGNOSIS — E119 Type 2 diabetes mellitus without complications: Secondary | ICD-10-CM | POA: Diagnosis not present

## 2022-05-25 ENCOUNTER — Other Ambulatory Visit: Payer: Self-pay | Admitting: Internal Medicine

## 2022-05-25 DIAGNOSIS — K5180 Other ulcerative colitis without complications: Secondary | ICD-10-CM

## 2022-06-25 ENCOUNTER — Other Ambulatory Visit: Payer: Self-pay | Admitting: Internal Medicine

## 2022-06-25 DIAGNOSIS — I10 Essential (primary) hypertension: Secondary | ICD-10-CM

## 2022-08-03 ENCOUNTER — Telehealth: Payer: Self-pay

## 2022-08-03 NOTE — Progress Notes (Signed)
   08/03/2022  Patient ID: Bruce Sanchez, male   DOB: 02/21/1956, 67 y.o.   MRN: [id number]  Patient outreach attempt to schedule telephone visit after patient appearing in quality report identifying failed measures in regard to adherence to diabetes, hypertension, and hyperlipidemia medications. Unable to reach patient but did leave voicemail with my direct number. Will attempt outreach again next week.   Lenna Gilford, PharmD, DPLA

## 2022-08-11 DIAGNOSIS — H353231 Exudative age-related macular degeneration, bilateral, with active choroidal neovascularization: Secondary | ICD-10-CM | POA: Diagnosis not present

## 2022-08-11 LAB — HM DIABETES EYE EXAM

## 2022-08-18 ENCOUNTER — Encounter: Payer: Self-pay | Admitting: Internal Medicine

## 2022-08-18 ENCOUNTER — Ambulatory Visit (INDEPENDENT_AMBULATORY_CARE_PROVIDER_SITE_OTHER): Payer: Medicare Other | Admitting: Internal Medicine

## 2022-08-18 VITALS — BP 128/74 | HR 84 | Temp 98.4°F | Resp 16 | Ht 70.0 in | Wt 185.0 lb

## 2022-08-18 DIAGNOSIS — Z7984 Long term (current) use of oral hypoglycemic drugs: Secondary | ICD-10-CM

## 2022-08-18 DIAGNOSIS — I10 Essential (primary) hypertension: Secondary | ICD-10-CM

## 2022-08-18 DIAGNOSIS — Z1159 Encounter for screening for other viral diseases: Secondary | ICD-10-CM | POA: Diagnosis not present

## 2022-08-18 DIAGNOSIS — Z125 Encounter for screening for malignant neoplasm of prostate: Secondary | ICD-10-CM | POA: Diagnosis not present

## 2022-08-18 DIAGNOSIS — R809 Proteinuria, unspecified: Secondary | ICD-10-CM

## 2022-08-18 DIAGNOSIS — E1129 Type 2 diabetes mellitus with other diabetic kidney complication: Secondary | ICD-10-CM | POA: Diagnosis not present

## 2022-08-18 DIAGNOSIS — E559 Vitamin D deficiency, unspecified: Secondary | ICD-10-CM | POA: Diagnosis not present

## 2022-08-18 DIAGNOSIS — E118 Type 2 diabetes mellitus with unspecified complications: Secondary | ICD-10-CM

## 2022-08-18 DIAGNOSIS — Z0001 Encounter for general adult medical examination with abnormal findings: Secondary | ICD-10-CM

## 2022-08-18 DIAGNOSIS — E785 Hyperlipidemia, unspecified: Secondary | ICD-10-CM

## 2022-08-18 DIAGNOSIS — Z0000 Encounter for general adult medical examination without abnormal findings: Secondary | ICD-10-CM

## 2022-08-18 LAB — HEPATIC FUNCTION PANEL
ALT: 25 U/L (ref 0–53)
AST: 22 U/L (ref 0–37)
Albumin: 4.7 g/dL (ref 3.5–5.2)
Alkaline Phosphatase: 67 U/L (ref 39–117)
Bilirubin, Direct: 0.1 mg/dL (ref 0.0–0.3)
Total Bilirubin: 0.6 mg/dL (ref 0.2–1.2)
Total Protein: 7.3 g/dL (ref 6.0–8.3)

## 2022-08-18 LAB — BASIC METABOLIC PANEL
BUN: 15 mg/dL (ref 6–23)
CO2: 31 mEq/L (ref 19–32)
Calcium: 9.8 mg/dL (ref 8.4–10.5)
Chloride: 101 mEq/L (ref 96–112)
Creatinine, Ser: 0.82 mg/dL (ref 0.40–1.50)
GFR: 89.91 mL/min (ref >60.00)
Glucose, Bld: 120 mg/dL — ABNORMAL HIGH (ref 70–99)
Potassium: 4.9 mEq/L (ref 3.5–5.1)
Sodium: 141 mEq/L (ref 135–145)

## 2022-08-18 LAB — MICROALBUMIN / CREATININE URINE RATIO
Creatinine,U: 61.8 mg/dL
Microalb Creat Ratio: 33.7 mg/g — ABNORMAL HIGH (ref 0.0–30.0)
Microalb, Ur: 20.9 mg/dL — ABNORMAL HIGH (ref 0.0–1.9)

## 2022-08-18 LAB — CBC WITH DIFFERENTIAL/PLATELET
Basophils Absolute: 0 10*3/uL (ref 0.0–0.1)
Basophils Relative: 0.6 % (ref 0.0–3.0)
Eosinophils Absolute: 0.1 10*3/uL (ref 0.0–0.7)
Eosinophils Relative: 1.2 % (ref 0.0–5.0)
HCT: 51.9 % (ref 39.0–52.0)
Hemoglobin: 17.1 g/dL — ABNORMAL HIGH (ref 13.0–17.0)
Lymphocytes Relative: 19.2 % (ref 12.0–46.0)
Lymphs Abs: 1.1 10*3/uL (ref 0.7–4.0)
MCHC: 32.9 g/dL (ref 30.0–36.0)
MCV: 92.4 fl (ref 78.0–100.0)
Monocytes Absolute: 0.8 10*3/uL (ref 0.1–1.0)
Monocytes Relative: 12.8 % — ABNORMAL HIGH (ref 3.0–12.0)
Neutro Abs: 3.9 10*3/uL (ref 1.4–7.7)
Neutrophils Relative %: 66.2 % (ref 43.0–77.0)
Platelets: 213 10*3/uL (ref 150.0–400.0)
RBC: 5.61 Mil/uL (ref 4.22–5.81)
RDW: 14.2 % (ref 11.5–15.5)
WBC: 5.9 10*3/uL (ref 4.0–10.5)

## 2022-08-18 LAB — PSA: PSA: 1.13 ng/mL (ref 0.10–4.00)

## 2022-08-18 LAB — LIPID PANEL
Cholesterol: 208 mg/dL — ABNORMAL HIGH (ref 0–200)
HDL: 45.9 mg/dL (ref >39.00)
NonHDL: 161.82
Total CHOL/HDL Ratio: 5
Triglycerides: 295 mg/dL — ABNORMAL HIGH (ref 0.0–149.0)
VLDL: 59 mg/dL — ABNORMAL HIGH (ref 0.0–40.0)

## 2022-08-18 LAB — VITAMIN D 25 HYDROXY (VIT D DEFICIENCY, FRACTURES): VITD: 17.35 ng/mL — ABNORMAL LOW (ref 30.00–100.00)

## 2022-08-18 LAB — HEMOGLOBIN A1C: Hgb A1c MFr Bld: 5.9 % (ref 4.6–6.5)

## 2022-08-18 LAB — TSH: TSH: 1.47 u[IU]/mL (ref 0.35–5.50)

## 2022-08-18 LAB — LDL CHOLESTEROL, DIRECT: Direct LDL: 126 mg/dL

## 2022-08-18 NOTE — Patient Instructions (Signed)
Health Maintenance, Male Adopting a healthy lifestyle and getting preventive care are important in promoting health and wellness. Ask your health care provider about: The right schedule for you to have regular tests and exams. Things you can do on your own to prevent diseases and keep yourself healthy. What should I know about diet, weight, and exercise? Eat a healthy diet  Eat a diet that includes plenty of vegetables, fruits, low-fat dairy products, and lean protein. Do not eat a lot of foods that are high in solid fats, added sugars, or sodium. Maintain a healthy weight Body mass index (BMI) is a measurement that can be used to identify possible weight problems. It estimates body fat based on height and weight. Your health care provider can help determine your BMI and help you achieve or maintain a healthy weight. Get regular exercise Get regular exercise. This is one of the most important things you can do for your health. Most adults should: Exercise for at least 150 minutes each week. The exercise should increase your heart rate and make you sweat (moderate-intensity exercise). Do strengthening exercises at least twice a week. This is in addition to the moderate-intensity exercise. Spend less time sitting. Even light physical activity can be beneficial. Watch cholesterol and blood lipids Have your blood tested for lipids and cholesterol at 69 years of age, then have this test every 5 years. You may need to have your cholesterol levels checked more often if: Your lipid or cholesterol levels are high. You are older than 69 years of age. You are at high risk for heart disease. What should I know about cancer screening? Many types of cancers can be detected early and may often be prevented. Depending on your health history and family history, you may need to have cancer screening at various ages. This may include screening for: Colorectal cancer. Prostate cancer. Skin cancer. Lung  cancer. What should I know about heart disease, diabetes, and high blood pressure? Blood pressure and heart disease High blood pressure causes heart disease and increases the risk of stroke. This is more likely to develop in people who have high blood pressure readings or are overweight. Talk with your health care provider about your target blood pressure readings. Have your blood pressure checked: Every 3-5 years if you are 18-39 years of age. Every year if you are 40 years old or older. If you are between the ages of 65 and 75 and are a current or former smoker, ask your health care provider if you should have a one-time screening for abdominal aortic aneurysm (AAA). Diabetes Have regular diabetes screenings. This checks your fasting blood sugar level. Have the screening done: Once every three years after age 45 if you are at a normal weight and have a low risk for diabetes. More often and at a younger age if you are overweight or have a high risk for diabetes. What should I know about preventing infection? Hepatitis B If you have a higher risk for hepatitis B, you should be screened for this virus. Talk with your health care provider to find out if you are at risk for hepatitis B infection. Hepatitis C Blood testing is recommended for: Everyone born from 1945 through 1965. Anyone with known risk factors for hepatitis C. Sexually transmitted infections (STIs) You should be screened each year for STIs, including gonorrhea and chlamydia, if: You are sexually active and are younger than 69 years of age. You are older than 69 years of age and your   health care provider tells you that you are at risk for this type of infection. Your sexual activity has changed since you were last screened, and you are at increased risk for chlamydia or gonorrhea. Ask your health care provider if you are at risk. Ask your health care provider about whether you are at high risk for HIV. Your health care provider  may recommend a prescription medicine to help prevent HIV infection. If you choose to take medicine to prevent HIV, you should first get tested for HIV. You should then be tested every 3 months for as long as you are taking the medicine. Follow these instructions at home: Alcohol use Do not drink alcohol if your health care provider tells you not to drink. If you drink alcohol: Limit how much you have to 0-2 drinks a day. Know how much alcohol is in your drink. In the [place] one drink equals one 12 oz bottle of beer (355 mL), one 5 oz glass of wine (148 mL), or one 1 oz glass of hard liquor (44 mL). Lifestyle Do not use any products that contain nicotine or tobacco. These products include cigarettes, chewing tobacco, and vaping devices, such as e-cigarettes. If you need help quitting, ask your health care provider. Do not use street drugs. Do not share needles. Ask your health care provider for help if you need support or information about quitting drugs. General instructions Schedule regular health, dental, and eye exams. Stay current with your vaccines. Tell your health care provider if: You often feel depressed. You have ever been abused or do not feel safe at home. Summary Adopting a healthy lifestyle and getting preventive care are important in promoting health and wellness. Follow your health care provider's instructions about healthy diet, exercising, and getting tested or screened for diseases. Follow your health care provider's instructions on monitoring your cholesterol and blood pressure. This information is not intended to replace advice given to you by your health care provider. Make sure you discuss any questions you have with your health care provider. Document Revised: 07/07/2020 Document Reviewed: 07/07/2020 Elsevier Patient Education  2024 [employer]  

## 2022-08-18 NOTE — Progress Notes (Signed)
Subjective:  Patient ID: Bruce Sanchez, male    DOB: 1955/04/24  Age: 67 y.o. MRN: [id number]  CC: Annual Exam and Hypertension   HPI Xai Frerking presents for a CPX and f/up ----   Discussed the use of AI scribe software for clinical note transcription with the patient, who gave verbal consent to proceed.  History of Present Illness   The patient, with a history of diabetes, aortic valve disease, and a goiter, presents with concerns about his medication regimen, specifically metformin and Jardiance. He reports a recent deviation from his diet and suggests waiting for upcoming blood work results to guide medication adjustments. He denies symptoms of hyperglycemia such as excessive thirst, urination, or drastic changes in appetite or weight. He has not been monitoring his blood sugar since it has been well-controlled.  He occasionally experiences a 'pinch' on the left side of his chest when lying in bed, but does not believe it to be significant. He denies other symptoms related to his aortic valve disease such as chest pain, shortness of breath, dizziness, or lightheadedness.  The patient has been experiencing loose stools for the past few days, but denies abdominal pain, nausea, vomiting, or blood in the stool. He continues to take sulfasalazine.  He recently had an eye exam where he received shots in both eyes. The provider reported no bleeding in the eyes. He has a family history of diabetes and vision loss, but is unsure if he has a history of diabetic retinopathy.  The patient reports that his goiter has been less bothersome recently. He denies pain or swelling in the front of his neck. He describes the back of his neck as feeling like arthritis. He denies any radiating pain towards his arms or legs, or any numbness, weakness, or tingling.  He has an upcoming ultrasound of the heart and a visit with a cardiologist. He was also supposed to have an ultrasound of his neck, but this was not  completed due to issues obtaining records from a previous provider.  The patient has a family history of prostate cancer in a twin brother. He does not report any symptoms of prostate cancer such as trouble urinating, blood in the urine, or painful urination, but expresses interest in testing for it.       Outpatient Medications Prior to Visit  Medication Sig Dispense Refill   amLODipine (NORVASC) 10 MG tablet Take 1 tablet (10 mg total) by mouth daily. 90 tablet 3   hydrocortisone (ANUSOL-HC) 2.5 % rectal cream Place 1 Application rectally 2 (two) times daily as needed for hemorrhoids or anal itching. 30 g 3   metoprolol succinate (TOPROL-XL) 50 MG 24 hr tablet TAKE 1 TABLET BY MOUTH DAILY WITH OR IMMEDIATELY FOLLOWING A MEAL 90 tablet 0   sulfaSALAzine (AZULFIDINE) 500 MG tablet TAKE TWO TABLETS BY MOUTH FOUR TIMES A DAY 720 tablet 0   JARDIANCE 25 MG TABS tablet TAKE ONE TABLET BY MOUTH DAILY BEFORE BREAKFAST 90 tablet 1   metFORMIN (GLUCOPHAGE-XR) 500 MG 24 hr tablet Take 500 mg by mouth at bedtime.     dicyclomine (BENTYL) 10 MG capsule Take by mouth.     No facility-administered medications prior to visit.    ROS Review of Systems  Constitutional: Negative.  Negative for chills, diaphoresis, fatigue and unexpected weight change.  HENT: Negative.    Eyes: Negative.   Respiratory: Negative.  Negative for chest tightness, shortness of breath and wheezing.   Cardiovascular:  Negative for chest pain,  palpitations and leg swelling.  Gastrointestinal:  Negative for abdominal pain, constipation, diarrhea, nausea and vomiting.  Endocrine: Negative.   Genitourinary: Negative.  Negative for difficulty urinating and dysuria.  Musculoskeletal: Negative.  Negative for arthralgias and myalgias.  Neurological:  Negative for dizziness and weakness.  Hematological:  Negative for adenopathy. Does not bruise/bleed easily.  Psychiatric/Behavioral: Negative.      Objective:  BP 128/74 (BP  Location: Left Arm, Patient Position: Sitting, Cuff Size: Large)   Pulse 84   Temp 98.4 F (36.9 C) (Oral)   Resp 16   Ht 5\' 10"  (1.778 m)   Wt 185 lb (83.9 kg)   SpO2 98%   BMI 26.54 kg/m   BP Readings from Last 3 Encounters:  08/18/22 128/74  03/31/22 126/60  02/10/22 132/72    Wt Readings from Last 3 Encounters:  08/18/22 185 lb (83.9 kg)  03/31/22 188 lb 12.8 oz (85.6 kg)  02/10/22 188 lb (85.3 kg)    Physical Exam Vitals reviewed.  Constitutional:      Appearance: Normal appearance.  HENT:     Mouth/Throat:     Mouth: Mucous membranes are moist.  Eyes:     General: No scleral icterus.    Conjunctiva/sclera: Conjunctivae normal.  Neck:     Thyroid: No thyroid mass, thyromegaly or thyroid tenderness.  Cardiovascular:     Rate and Rhythm: Normal rate and regular rhythm.     Heart sounds: Murmur heard.     Systolic murmur is present with a grade of 2/6.     No friction rub. No gallop.     Comments: 2/6 SEM RUSB Pulmonary:     Effort: Pulmonary effort is normal.     Breath sounds: No stridor. No wheezing, rhonchi or rales.  Musculoskeletal:     Cervical back: Neck supple.     Right lower leg: No edema.     Left lower leg: No edema.  Lymphadenopathy:     Cervical: No cervical adenopathy.     Right cervical: No superficial, deep or posterior cervical adenopathy.    Left cervical: No superficial, deep or posterior cervical adenopathy.  Neurological:     Mental Status: He is alert.     Lab Results  Component Value Date   WBC 5.9 08/18/2022   HGB 17.1 (H) 08/18/2022   HCT 51.9 08/18/2022   PLT 213.0 08/18/2022   GLUCOSE 120 (H) 08/18/2022   CHOL 208 (H) 08/18/2022   TRIG 295.0 (H) 08/18/2022   HDL 45.90 08/18/2022   LDLDIRECT 126.0 08/18/2022   LDLCALC 48 08/11/2021   ALT 25 08/18/2022   AST 22 08/18/2022   NA 141 08/18/2022   K 4.9 08/18/2022   CL 101 08/18/2022   CREATININE 0.82 08/18/2022   BUN 15 08/18/2022   CO2 31 08/18/2022   TSH 1.47  08/18/2022   PSA 1.13 08/18/2022   HGBA1C 5.9 08/18/2022   MICROALBUR 20.9 (H) 08/18/2022    No results found.  Assessment & Plan:   Encounter for general adult medical examination with abnormal findings- Exam completed, labs reviewed, vaccines are up-to-date, cancer screenings are up-to-date, patient education was given.  Hyperlipidemia LDL goal <70- I recommend a statin for cardiovascular risk reduction. -     Lipid panel; Future -     TSH; Future -     Hepatic function panel; Future -     Rosuvastatin Calcium; Take 1 tablet (20 mg total) by mouth daily.  Dispense: 90 tablet; Refill: 1  Primary -  His blood pressure is adequately well-controlled. -     Basic metabolic panel; Future -     CBC with Differential/Platelet; Future -     TSH; Future -     Urinalysis, Routine w reflex microscopic; Future  Screening for prostate cancer -     PSA; Future  Type II diabetes mellitus with manifestations (HCC)- His blood sugar is well-controlled. -     Basic metabolic panel; Future -     Urinalysis, Routine w reflex microscopic; Future -     Hemoglobin A1c; Future -     Microalbumin / creatinine urine ratio; Future -     HM Diabetes Foot Exam -     Empagliflozin; TAKE ONE TABLET BY MOUTH DAILY BEFORE BREAKFAST  Dispense: 90 tablet; Refill: 1  Vitamin D deficiency disease -     VITAMIN D 25 Hydroxy (Vit-D Deficiency, Fractures); Future -     Cholecalciferol; Take 1 capsule (50,000 Units total) by mouth once a week.  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  Other orders -     LDL cholesterol, direct     Follow-up: Return in about 6 months (around 02/17/2023).  Sanda Linger, MD

## 2022-08-19 DIAGNOSIS — E1129 Type 2 diabetes mellitus with other diabetic kidney complication: Secondary | ICD-10-CM | POA: Insufficient documentation

## 2022-08-19 DIAGNOSIS — Z1159 Encounter for screening for other viral diseases: Secondary | ICD-10-CM | POA: Insufficient documentation

## 2022-08-19 LAB — URINALYSIS, ROUTINE W REFLEX MICROSCOPIC
Bilirubin Urine: NEGATIVE
Hgb urine dipstick: NEGATIVE
Ketones, ur: NEGATIVE
Leukocytes,Ua: NEGATIVE
Nitrite: NEGATIVE
RBC / HPF: NONE SEEN (ref 0–?)
Specific Gravity, Urine: 1.02 (ref 1.000–1.030)
Urine Glucose: >=1000 — AB
Urobilinogen, UA: 0.2 (ref 0.0–1.0)
WBC, UA: NONE SEEN (ref 0–?)
pH: 5 (ref 5.0–8.0)

## 2022-08-19 LAB — HEPATITIS C ANTIBODY: Hepatitis C Ab: NONREACTIVE

## 2022-08-19 MED ORDER — CHOLECALCIFEROL 1.25 MG (50000 UT) PO CAPS: 50000.0000 [IU] | ORAL_CAPSULE | ORAL | 0 refills | Status: DC

## 2022-08-19 MED ORDER — ROSUVASTATIN CALCIUM 20 MG PO TABS: 20.0000 mg | ORAL_TABLET | Freq: Every day | ORAL | 1 refills | Status: DC

## 2022-08-19 MED ORDER — EMPAGLIFLOZIN 25 MG PO TABS: ORAL_TABLET | ORAL | 1 refills | Status: DC

## 2022-08-31 ENCOUNTER — Ambulatory Visit (HOSPITAL_COMMUNITY)
Admission: RE | Admit: 2022-08-31 | Discharge: 2022-08-31 | Disposition: A | Discharge status: Home / Self Care | Payer: Medicare Other | Source: Ambulatory Visit | Attending: Cardiology | Admitting: Cardiology

## 2022-08-31 DIAGNOSIS — I77810 Thoracic aortic ectasia: Secondary | ICD-10-CM | POA: Insufficient documentation

## 2022-08-31 DIAGNOSIS — E049 Nontoxic goiter, unspecified: Secondary | ICD-10-CM | POA: Diagnosis not present

## 2022-08-31 DIAGNOSIS — D1809 Hemangioma of other sites: Secondary | ICD-10-CM | POA: Diagnosis not present

## 2022-08-31 DIAGNOSIS — I2510 Atherosclerotic heart disease of native coronary artery without angina pectoris: Secondary | ICD-10-CM | POA: Diagnosis not present

## 2022-08-31 MED ORDER — IOHEXOL 350 MG/ML SOLN
75.0000 mL | Freq: Once | INTRAVENOUS | Status: AC | PRN
  Administered 2022-08-31: 75 mL via INTRAVENOUS

## 2022-09-05 ENCOUNTER — Other Ambulatory Visit: Payer: Self-pay | Admitting: Internal Medicine

## 2022-09-05 DIAGNOSIS — K5180 Other ulcerative colitis without complications: Secondary | ICD-10-CM

## 2022-09-09 ENCOUNTER — Other Ambulatory Visit: Payer: Self-pay | Admitting: Cardiology

## 2022-09-17 ENCOUNTER — Encounter: Payer: Self-pay | Admitting: *Deleted

## 2022-09-21 ENCOUNTER — Telehealth: Payer: Self-pay | Admitting: *Deleted

## 2022-09-21 NOTE — Telephone Encounter (Signed)
Left message for pt to call.

## 2022-09-21 NOTE — Telephone Encounter (Signed)
-----   Message from Rollene Rotunda sent at 09/19/2022  9:47 AM EDT ----- Did we get this ordered?  It doesn't look like we got in contact with him. ----- Message ----- From: Malon Kindle, MD Sent: 09/07/2022   3:11 PM EDT To: Rollene Rotunda, MD  Hey There,  The lesion is indeterminate but non aggressive appearing. If pt. has known history of malignancy which can cause lytic bone lesions (such as renal, lung, thyroid, multiple myeloma, lymphoma. etc) I would get PET CT scan now. If not, I will not get anything now. Will get a follow up in 6 months or earlier, as needed.  Thank you, Nisarg.  ----- Message ----- From: Rollene Rotunda, MD Sent: 09/07/2022   2:44 PM EDT To: Malon Kindle, MD  Hi,  Can you please suggest the best way to further image the sternal lesion you report.  Thanks. Leta Jungling

## 2022-09-28 ENCOUNTER — Other Ambulatory Visit: Payer: Self-pay | Admitting: Internal Medicine

## 2022-09-28 DIAGNOSIS — I10 Essential (primary) hypertension: Secondary | ICD-10-CM

## 2022-09-28 NOTE — Telephone Encounter (Signed)
Left message for pt to call.

## 2022-10-01 ENCOUNTER — Other Ambulatory Visit: Payer: Self-pay | Admitting: Internal Medicine

## 2022-10-01 DIAGNOSIS — E042 Nontoxic multinodular goiter: Secondary | ICD-10-CM

## 2022-10-09 NOTE — Progress Notes (Unsigned)
Cardiology Office Note:   Date:  10/13/2022  ID:  Bruce Sanchez, DOB Jun 18, 1955, MRN [id number] PCP: Etta Grandchild, MD  [facility] Providers Cardiologist:  Rollene Rotunda, MD {  History of Present Illness:   Bruce Sanchez is a 67 y.o. male  who presents for evaluation of aortic stenosis and SOB.   His AS was mild to moderate in April.  He is noted to have coronary calcium.  However, he had a negative perfusion study in 2023.  He had no new symptoms since then.  He had a question of an enlarged aorta on echo but this was not visualized on his CT.  He did have some coronary calcium.  Was a lesion on his sternum that was possibly a hemangioma.  I discussed this with the radiologist to sedentary absence of a known malignancy he would suggest a follow-up CT in 6 months noncontrasted and we have scheduled this.  Since I last saw him he has had no new cardiovascular complaints.  He denies any chest pressure, neck or arm discomfort.  She had no weight gain or edema.  He had no new shortness of breath, PND or orthopnea.   ROS: As stated in the HPI and negative for all other systems.  Studies Reviewed:    EKG:   EKG Interpretation Date/Time:  Tuesday October 12 2022 14:27:07 EDT Ventricular Rate:  74 PR Interval:  164 QRS Duration:  88 QT Interval:  382 QTC Calculation: 424 R Axis:   -2  Text Interpretation: Normal sinus rhythm Minimal voltage criteria for LVH, may be normal variant ( R in aVL ) Poor anterior R wave progression Unchanged from previous Confirmed by Rollene Rotunda ([id number]) on 10/13/2022 9:10:25 AM     Risk Assessment/Calculations:              Physical Exam:   VS:  BP 128/66 (BP Location: Left Arm, Patient Position: Sitting, Cuff Size: Normal)   Pulse 74   Ht 5\' 10"  (1.778 m)   Wt 191 lb (86.6 kg)   BMI 27.41 kg/m    Wt Readings from Last 3 Encounters:  10/12/22 191 lb (86.6 kg)  08/18/22 185 lb (83.9 kg)  03/31/22 188 lb 12.8 oz (85.6 kg)     GEN: Well  nourished, well developed in no acute distress NECK: No JVD; No carotid bruits CARDIAC: RRR, soft early peaking systolic murmur, no diastolic murmurs, rubs, gallops RESPIRATORY:  Clear to auscultation without rales, wheezing or rhonchi  ABDOMEN: Soft, non-tender, non-distended EXTREMITIES:  No edema; No deformity   ASSESSMENT AND PLAN:   Aortic stenosis:   He has some mild aortic stenosis.  He will follow this clinically.  No change in therapy.  HTN: His blood pressure is at target.  No change in therapy.  DM: A1c is 5.9 and is steadily come down.   Dyslipidemia: I switched him to Crestor at the last visit in his total is 208.  The HDL of 45.9.  No change in therapy.  He said he was not tolerating Lipitor but he seems to be tolerating this.    Aortic root enlargement:   No aneurysm was noted on CT in July.   We will follow follow his sternum with other imaging CT in 6 months.    Sternal lesion: See above  CAD:  He had coronary calcium noted on CT. he is pursuing aggressive risk reduction.  No change in therapy.       Follow up with me  in 12 months.   Signed, Rollene Rotunda, MD

## 2022-10-12 ENCOUNTER — Encounter: Payer: Self-pay | Admitting: Cardiology

## 2022-10-12 ENCOUNTER — Ambulatory Visit: Payer: Medicare Other | Attending: Cardiology | Admitting: Cardiology

## 2022-10-12 VITALS — BP 128/66 | HR 74 | Ht 70.0 in | Wt 191.0 lb

## 2022-10-12 DIAGNOSIS — I350 Nonrheumatic aortic (valve) stenosis: Secondary | ICD-10-CM | POA: Diagnosis not present

## 2022-10-12 NOTE — Telephone Encounter (Signed)
Discussed at office visit 10/12/22.

## 2022-10-12 NOTE — Patient Instructions (Signed)
  Follow-Up: At [facility], you and your health needs are our priority.  As part of our continuing mission to provide you with exceptional heart care, we have created designated Provider Care Teams.  These Care Teams include your primary Cardiologist (physician) and Advanced Practice Providers (APPs -  Physician Assistants and Nurse Practitioners) who all work together to provide you with the care you need, when you need it.  We recommend signing up for the patient portal called "MyChart".  Sign up information is provided on this After Visit Summary.  MyChart is used to connect with patients for Virtual Visits (Telemedicine).  Patients are able to view lab/test results, encounter notes, upcoming appointments, etc.  Non-urgent messages can be sent to your provider as well.   To learn more about what you can do with MyChart, go to [email or web address].    Your next appointment:   6 month(s)  Provider:   James Hochrein, MD      

## 2022-10-13 ENCOUNTER — Encounter: Payer: Self-pay | Admitting: Cardiology

## 2022-10-19 ENCOUNTER — Other Ambulatory Visit: Payer: Medicare Other

## 2022-10-20 ENCOUNTER — Ambulatory Visit
Admission: RE | Admit: 2022-10-20 | Discharge: 2022-10-20 | Disposition: A | Discharge status: Home / Self Care | Payer: Medicare Other | Source: Ambulatory Visit | Attending: Internal Medicine | Admitting: Internal Medicine

## 2022-10-20 DIAGNOSIS — E049 Nontoxic goiter, unspecified: Secondary | ICD-10-CM | POA: Diagnosis not present

## 2022-10-20 DIAGNOSIS — E042 Nontoxic multinodular goiter: Secondary | ICD-10-CM

## 2022-11-10 ENCOUNTER — Other Ambulatory Visit: Payer: Self-pay | Admitting: *Deleted

## 2022-11-10 MED ORDER — AMLODIPINE BESYLATE 10 MG PO TABS: 10.0000 mg | ORAL_TABLET | Freq: Every day | ORAL | 3 refills | Status: DC

## 2022-11-24 ENCOUNTER — Other Ambulatory Visit: Payer: Self-pay | Admitting: Internal Medicine

## 2022-11-24 DIAGNOSIS — K5180 Other ulcerative colitis without complications: Secondary | ICD-10-CM

## 2022-12-01 DIAGNOSIS — H353231 Exudative age-related macular degeneration, bilateral, with active choroidal neovascularization: Secondary | ICD-10-CM | POA: Diagnosis not present

## 2022-12-29 ENCOUNTER — Other Ambulatory Visit: Payer: Self-pay | Admitting: Internal Medicine

## 2022-12-29 DIAGNOSIS — I10 Essential (primary) hypertension: Secondary | ICD-10-CM

## 2023-01-12 ENCOUNTER — Ambulatory Visit (INDEPENDENT_AMBULATORY_CARE_PROVIDER_SITE_OTHER): Payer: Medicare Other | Admitting: Internal Medicine

## 2023-01-12 ENCOUNTER — Ambulatory Visit (INDEPENDENT_AMBULATORY_CARE_PROVIDER_SITE_OTHER): Payer: Medicare Other

## 2023-01-12 ENCOUNTER — Encounter: Payer: Self-pay | Admitting: Internal Medicine

## 2023-01-12 VITALS — BP 128/72 | HR 76 | Temp 97.7°F | Resp 16 | Ht 70.0 in | Wt 195.6 lb

## 2023-01-12 DIAGNOSIS — M79641 Pain in right hand: Secondary | ICD-10-CM | POA: Diagnosis not present

## 2023-01-12 DIAGNOSIS — G8929 Other chronic pain: Secondary | ICD-10-CM | POA: Diagnosis not present

## 2023-01-12 DIAGNOSIS — M1811 Unilateral primary osteoarthritis of first carpometacarpal joint, right hand: Secondary | ICD-10-CM | POA: Diagnosis not present

## 2023-01-12 MED ORDER — MELOXICAM 7.5 MG PO TABS
7.5000 mg | ORAL_TABLET | Freq: Every day | ORAL | 0 refills | Status: DC
Start: 2023-01-12 — End: 2023-08-03

## 2023-01-12 NOTE — Patient Instructions (Signed)

## 2023-01-12 NOTE — Progress Notes (Signed)
Subjective:  Patient ID: Bruce Sanchez, male    DOB: 11-16-1955  Age: 66 y.o. MRN: [id number]  CC: Hand Pain   HPI Bruce Sanchez presents for f/up -----  He complains of chronic,worsening pain in his right hand that he localizes to the base of the thumb with extension into the hand.  Outpatient Medications Prior to Visit  Medication Sig Dispense Refill   amLODipine (NORVASC) 10 MG tablet Take 1 tablet (10 mg total) by mouth daily. 90 tablet 3   Cholecalciferol 1.25 MG (50000 UT) capsule Take 1 capsule (50,000 Units total) by mouth once a week. 12 capsule 0   metoprolol succinate (TOPROL-XL) 50 MG 24 hr tablet TAKE 1 TABLET BY MOUTH DAILY WITH OR IMMEDIATELY FOLLOWING A MEAL 90 tablet 0   rosuvastatin (CRESTOR) 20 MG tablet Take 1 tablet (20 mg total) by mouth daily. 90 tablet 1   sulfaSALAzine (AZULFIDINE) 500 MG tablet TAKE TWO TABLETS BY MOUTH FOUR TIMES A DAY 720 tablet 0   dicyclomine (BENTYL) 10 MG capsule Take by mouth.     hydrocortisone (ANUSOL-HC) 2.5 % rectal cream Place 1 Application rectally 2 (two) times daily as needed for hemorrhoids or anal itching. (Patient not taking: Reported on 01/12/2023) 30 g 3   No facility-administered medications prior to visit.    ROS Review of Systems  Constitutional: Negative.   HENT: Negative.    Respiratory: Negative.  Negative for cough, chest tightness and wheezing.   Cardiovascular:  Negative for chest pain, palpitations and leg swelling.  Gastrointestinal: Negative.  Negative for abdominal pain.  Genitourinary: Negative.  Negative for difficulty urinating.  Musculoskeletal:  Positive for arthralgias. Negative for back pain and myalgias.  Neurological: Negative.  Negative for dizziness and weakness.  Hematological:  Negative for adenopathy. Does not bruise/bleed easily.  Psychiatric/Behavioral: Negative.      Objective:  BP 128/72   Pulse 76   Temp 97.7 F (36.5 C) (Oral)   Resp 16   Ht 5\' 10"  (1.778 m)   Wt 195 lb 9.6  oz (88.7 kg)   SpO2 96%   BMI 28.07 kg/m   BP Readings from Last 3 Encounters:  01/12/23 128/72  10/12/22 128/66  08/18/22 128/74    Wt Readings from Last 3 Encounters:  01/12/23 195 lb 9.6 oz (88.7 kg)  10/12/22 191 lb (86.6 kg)  08/18/22 185 lb (83.9 kg)    Physical Exam Vitals reviewed.  Constitutional:      Appearance: Normal appearance. He is not ill-appearing.  HENT:     Mouth/Throat:     Mouth: Mucous membranes are moist.  Eyes:     General: No scleral icterus.    Conjunctiva/sclera: Conjunctivae normal.  Cardiovascular:     Rate and Rhythm: Normal rate and regular rhythm.     Heart sounds: Murmur heard.     Friction rub present. No gallop.  Pulmonary:     Effort: Pulmonary effort is normal.     Breath sounds: No stridor. No wheezing, rhonchi or rales.  Abdominal:     General: Abdomen is flat.     Palpations: There is no mass.     Tenderness: There is no abdominal tenderness. There is no guarding.     Hernia: No hernia is present.  Musculoskeletal:        General: Deformity present. Normal range of motion.     Right hand: Deformity present.     Left hand: Deformity present.     Cervical back: Neck supple.  Right lower leg: No edema.     Left lower leg: No edema.     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 There is no synovitis.  Lymphadenopathy:     Cervical: No cervical adenopathy.  Skin:    General: Skin is dry.     Findings: No erythema or rash.  Neurological:     General: No focal deficit present.     Mental Status: He is alert. Mental status is at baseline.  Psychiatric:        Mood and Affect: Mood normal.        Behavior: Behavior normal.     Lab Results  Component Value Date   WBC 5.9 08/18/2022   HGB 17.1 (H) 08/18/2022   HCT 51.9 08/18/2022   PLT 213.0 08/18/2022   GLUCOSE 120 (H) 08/18/2022   CHOL 208 (H) 08/18/2022   TRIG 295.0 (H) 08/18/2022   HDL 45.90 08/18/2022   LDLDIRECT 126.0  08/18/2022   LDLCALC 48 08/11/2021   ALT 25 08/18/2022   AST 22 08/18/2022   NA 141 08/18/2022   K 4.9 08/18/2022   CL 101 08/18/2022   CREATININE 0.82 08/18/2022   BUN 15 08/18/2022   CO2 31 08/18/2022   TSH 1.47 08/18/2022   PSA 1.13 08/18/2022   HGBA1C 5.9 08/18/2022   MICROALBUR 20.9 (H) 08/18/2022    US THYROID  Result Date: 10/23/2022 CLINICAL DATA:  Thyroid nodule follow-up EXAM: THYROID ULTRASOUND TECHNIQUE: Ultrasound examination of the thyroid gland and adjacent soft tissues was performed. COMPARISON:  Ultrasound thyroid 10/14/2021 CT angiography chest 08/31/2022 FINDINGS: Parenchymal Echotexture: Moderately heterogenous Isthmus: 1.0 cm Right lobe: 7.5 x 3.4 x 4.3 cm Left lobe: 7.6 x 2.8 x 4.8 cm _________________________________________________________ Estimated total number of nodules >/= 1 cm: 0 Number of spongiform nodules >/=  2 cm not described below (TR1): 0 Number of mixed cystic and solid nodules >/= 1.5 cm not described below (TR2): 0 _________________________________________________________ The previously seen thyroid nodules are not discretely identified on the current examination due to the ill-defined borders and likely represents pseudonodules. Hypoechoic heterogeneous region in the right sternal notch measuring 2.8 x 1.5 x 2.1 cm appears to relate to thickening right sternoclavicular soft tissues, based on the appearance on chest CTA from 08/31/2022. IMPRESSION: 1. Enlarged heterogeneous thyroid consistent with goiter. 2. The nodules seen on prior examination are not delineated well enough to measure on the current exam and likely represented pseudo nodules. The above is in keeping with the ACR TI-RADS recommendations - J Am Coll Radiol 2017;14:587-595. Electronically Signed   By: Acquanetta Belling M.D.   On: 10/23/2022 20:16    Assessment & Plan:   Chronic hand pain, right- Will evaluate with plain films. -     DG Hand Complete Right; Future  Arthritis of  carpometacarpal (CMC) joint of right thumb- Will treat with an NSAID. -     Meloxicam; Take 1 tablet (7.5 mg total) by mouth daily.  Dispense: 90 tablet; Refill: 0     Follow-up: Return in about 3 months (around 04/14/2023).  Sanda Linger, MD

## 2023-02-08 ENCOUNTER — Other Ambulatory Visit: Payer: Self-pay | Admitting: *Deleted

## 2023-02-08 DIAGNOSIS — M899 Disorder of bone, unspecified: Secondary | ICD-10-CM

## 2023-02-15 ENCOUNTER — Ambulatory Visit
Admission: RE | Admit: 2023-02-15 | Discharge: 2023-02-15 | Disposition: A | Discharge status: Home / Self Care | Payer: Medicare Other | Source: Ambulatory Visit | Attending: Cardiology | Admitting: Cardiology

## 2023-02-15 DIAGNOSIS — M899 Disorder of bone, unspecified: Secondary | ICD-10-CM

## 2023-02-15 DIAGNOSIS — I288 Other diseases of pulmonary vessels: Secondary | ICD-10-CM | POA: Diagnosis not present

## 2023-02-15 DIAGNOSIS — I2510 Atherosclerotic heart disease of native coronary artery without angina pectoris: Secondary | ICD-10-CM | POA: Diagnosis not present

## 2023-02-15 DIAGNOSIS — I700 Atherosclerosis of aorta: Secondary | ICD-10-CM | POA: Diagnosis not present

## 2023-02-16 ENCOUNTER — Ambulatory Visit: Payer: Medicare Other | Admitting: Internal Medicine

## 2023-02-16 DIAGNOSIS — H353231 Exudative age-related macular degeneration, bilateral, with active choroidal neovascularization: Secondary | ICD-10-CM | POA: Diagnosis not present

## 2023-02-24 ENCOUNTER — Other Ambulatory Visit: Payer: Self-pay | Admitting: Internal Medicine

## 2023-02-24 DIAGNOSIS — K5180 Other ulcerative colitis without complications: Secondary | ICD-10-CM

## 2023-02-24 DIAGNOSIS — E785 Hyperlipidemia, unspecified: Secondary | ICD-10-CM

## 2023-03-05 ENCOUNTER — Other Ambulatory Visit: Payer: Self-pay | Admitting: Internal Medicine

## 2023-03-05 DIAGNOSIS — K5180 Other ulcerative colitis without complications: Secondary | ICD-10-CM

## 2023-03-28 ENCOUNTER — Other Ambulatory Visit: Payer: Self-pay | Admitting: Internal Medicine

## 2023-03-28 DIAGNOSIS — I10 Essential (primary) hypertension: Secondary | ICD-10-CM

## 2023-04-13 ENCOUNTER — Ambulatory Visit: Payer: Medicare Other | Admitting: Internal Medicine

## 2023-04-13 ENCOUNTER — Encounter: Payer: Self-pay | Admitting: Internal Medicine

## 2023-04-13 VITALS — BP 140/72 | HR 71 | Temp 98.2°F | Ht 70.0 in | Wt 195.0 lb

## 2023-04-13 DIAGNOSIS — E118 Type 2 diabetes mellitus with unspecified complications: Secondary | ICD-10-CM

## 2023-04-13 DIAGNOSIS — E042 Nontoxic multinodular goiter: Secondary | ICD-10-CM

## 2023-04-13 DIAGNOSIS — I10 Essential (primary) hypertension: Secondary | ICD-10-CM

## 2023-04-13 DIAGNOSIS — Z7984 Long term (current) use of oral hypoglycemic drugs: Secondary | ICD-10-CM

## 2023-04-13 DIAGNOSIS — I350 Nonrheumatic aortic (valve) stenosis: Secondary | ICD-10-CM

## 2023-04-13 DIAGNOSIS — E785 Hyperlipidemia, unspecified: Secondary | ICD-10-CM | POA: Diagnosis not present

## 2023-04-13 LAB — BASIC METABOLIC PANEL
BUN: 12 mg/dL (ref 6–23)
CO2: 33 meq/L — ABNORMAL HIGH (ref 19–32)
Calcium: 9.7 mg/dL (ref 8.4–10.5)
Chloride: 100 meq/L (ref 96–112)
Creatinine, Ser: 0.65 mg/dL (ref 0.40–1.50)
GFR: 96 mL/min (ref >60.00)
Glucose, Bld: 311 mg/dL — ABNORMAL HIGH (ref 70–99)
Potassium: 4.3 meq/L (ref 3.5–5.1)
Sodium: 139 meq/L (ref 135–145)

## 2023-04-13 LAB — CBC WITH DIFFERENTIAL/PLATELET
Basophils Absolute: 0 10*3/uL (ref 0.0–0.1)
Basophils Relative: 0.3 % (ref 0.0–3.0)
Eosinophils Absolute: 0.1 10*3/uL (ref 0.0–0.7)
Eosinophils Relative: 1 % (ref 0.0–5.0)
HCT: 47.9 % (ref 39.0–52.0)
Hemoglobin: 15.8 g/dL (ref 13.0–17.0)
Lymphocytes Relative: 20.2 % (ref 12.0–46.0)
Lymphs Abs: 1.2 10*3/uL (ref 0.7–4.0)
MCHC: 32.9 g/dL (ref 30.0–36.0)
MCV: 91.5 fL (ref 78.0–100.0)
Monocytes Absolute: 0.7 10*3/uL (ref 0.1–1.0)
Monocytes Relative: 11.5 % (ref 3.0–12.0)
Neutro Abs: 4 10*3/uL (ref 1.4–7.7)
Neutrophils Relative %: 67 % (ref 43.0–77.0)
Platelets: 184 10*3/uL (ref 150.0–400.0)
RBC: 5.23 Mil/uL (ref 4.22–5.81)
RDW: 14.2 % (ref 11.5–15.5)
WBC: 5.9 10*3/uL (ref 4.0–10.5)

## 2023-04-13 LAB — TSH: TSH: 1.57 u[IU]/mL (ref 0.35–5.50)

## 2023-04-13 NOTE — Progress Notes (Signed)
Subjective:  Patient ID: Bruce Sanchez, male    DOB: 12/23/1955  Age: 68 y.o. MRN: [id number]  CC: Hypertension and Hyperlipidemia   HPI Bruce Sanchez presents for f/up ----  Discussed the use of AI scribe software for clinical note transcription with the patient, who gave verbal consent to proceed.  History of Present Illness   He experiences intermittent dull chest pain while lying on his left side, which resolves upon changing position. The pain is not severe and is not associated with exertion, pressure, or tightness. He recalls an episode occurring approximately two days ago.  He has frequent nocturia, which he attributes to drinking a bottle of water nightly. He mentions a recent high blood glucose level of 290 mg/dL, which he attributes to dietary indiscretions.  He takes metoprolol without experiencing side effects such as dizziness or lightheadedness. He also takes rosuvastatin and reports no muscle or joint aches, except for arthritis in his thumbs, which has improved with previous treatment. He continues to experience some thumb pain, but it is now bearable.  Regarding his thyroid nodules, he denies any current symptoms such as painful swallowing or dysphagia, noting an improvement compared to previous experiences.  No symptoms of hypertension such as headache, blurred vision, chest pain, shortness of breath, dizziness, or lightheadedness. He consumed two cups of coffee prior to the visit.       Outpatient Medications Prior to Visit  Medication Sig Dispense Refill   amLODipine (NORVASC) 10 MG tablet Take 1 tablet (10 mg total) by mouth daily. 90 tablet 3   dicyclomine (BENTYL) 10 MG capsule Take by mouth.     hydrocortisone (ANUSOL-HC) 2.5 % rectal cream Place 1 Application rectally 2 (two) times daily as needed for hemorrhoids or anal itching. 30 g 3   meloxicam (MOBIC) 7.5 MG tablet Take 1 tablet (7.5 mg total) by mouth daily. 90 tablet 0   metoprolol succinate (TOPROL-XL)  50 MG 24 hr tablet TAKE 1 TABLET BY MOUTH DAILY WITH OR IMMEDIATELY FOLLOWING A MEAL 90 tablet 0   rosuvastatin (CRESTOR) 20 MG tablet TAKE 1 TABLET BY MOUTH DAILY 90 tablet 1   sulfaSALAzine (AZULFIDINE) 500 MG tablet TAKE 2 TABLETS BY MOUTH FOUR TIMES A DAY 720 tablet 0   Cholecalciferol 1.25 MG (50000 UT) capsule Take 1 capsule (50,000 Units total) by mouth once a week. 12 capsule 0   No facility-administered medications prior to visit.    ROS Review of Systems  Constitutional:  Positive for unexpected weight change (wt gain). Negative for appetite change, chills, diaphoresis, fatigue and fever.  HENT: Negative.  Negative for trouble swallowing.   Eyes: Negative.   Respiratory: Negative.  Negative for cough, chest tightness, shortness of breath and wheezing.   Cardiovascular:  Negative for chest pain, palpitations and leg swelling.  Gastrointestinal: Negative.  Negative for abdominal pain, constipation, diarrhea, nausea and vomiting.  Endocrine: Negative.   Genitourinary:  Negative for difficulty urinating and dysuria.  Musculoskeletal: Negative.  Negative for arthralgias and myalgias.  Skin: Negative.   Neurological:  Negative for dizziness, weakness and headaches.  Hematological:  Negative for adenopathy. Does not bruise/bleed easily.  Psychiatric/Behavioral: Negative.      Objective:  BP (!) 140/72 (BP Location: Left Arm, Patient Position: Sitting, Cuff Size: Normal)   Pulse 71   Temp 98.2 F (36.8 C) (Oral)   Ht 5\' 10"  (1.778 m)   Wt 195 lb (88.5 kg)   SpO2 95%   BMI 27.98 kg/m   BP Readings  from Last 3 Encounters:  04/13/23 (!) 140/72  01/12/23 128/72  10/12/22 128/66    Wt Readings from Last 3 Encounters:  04/13/23 195 lb (88.5 kg)  01/12/23 195 lb 9.6 oz (88.7 kg)  10/12/22 191 lb (86.6 kg)    Physical Exam Vitals reviewed.  Constitutional:      Appearance: Normal appearance.  HENT:     Mouth/Throat:     Mouth: Mucous membranes are moist.  Eyes:      General: No scleral icterus.    Conjunctiva/sclera: Conjunctivae normal.  Neck:     Thyroid: Thyromegaly present. No thyroid mass or thyroid tenderness.  Cardiovascular:     Rate and Rhythm: Normal rate and regular rhythm.     Heart sounds: Murmur heard.     Systolic murmur is present with a grade of 2/6.     No friction rub. No gallop.     Comments: EKG- NSR, 68 bpm Minimal LVH Anterior infarct pattern is old (V2 is normal now) No acute ST/T wave changes Pulmonary:     Breath sounds: No stridor. No wheezing, rhonchi or rales.  Abdominal:     General: Abdomen is flat.     Palpations: There is no mass.     Tenderness: There is no abdominal tenderness. There is no guarding.     Hernia: No hernia is present.  Musculoskeletal:        General: Normal range of motion.     Cervical back: Normal range of motion and neck supple. No crepitus. Normal range of motion.     Right lower leg: No edema.     Left lower leg: No edema.  Skin:    General: Skin is warm and dry.  Neurological:     General: No focal deficit present.     Mental Status: He is alert. Mental status is at baseline.  Psychiatric:        Mood and Affect: Mood normal.     Lab Results  Component Value Date   WBC 5.9 04/13/2023   HGB 15.8 04/13/2023   HCT 47.9 04/13/2023   PLT 184.0 04/13/2023   GLUCOSE 311 (H) 04/13/2023   CHOL 208 (H) 08/18/2022   TRIG 295.0 (H) 08/18/2022   HDL 45.90 08/18/2022   LDLDIRECT 126.0 08/18/2022   LDLCALC 48 08/11/2021   ALT 25 08/18/2022   AST 22 08/18/2022   NA 139 04/13/2023   K 4.3 04/13/2023   CL 100 04/13/2023   CREATININE 0.65 04/13/2023   BUN 12 04/13/2023   CO2 33 (H) 04/13/2023   TSH 1.57 04/13/2023   PSA 1.13 08/18/2022   HGBA1C 8.5 (H) 04/13/2023   MICROALBUR 20.9 (H) 08/18/2022    CT Chest Wo Contrast Result Date: 03/01/2023 CLINICAL DATA:  Follow-up of sternal lesion. EXAM: CT CHEST WITHOUT CONTRAST TECHNIQUE: Multidetector CT imaging of the chest was  performed following the standard protocol without IV contrast. RADIATION DOSE REDUCTION: This exam was performed according to the departmental dose-optimization program which includes automated exposure control, adjustment of the mA and/or kV according to patient size and/or use of iterative reconstruction technique. COMPARISON:  08/31/2022 FINDINGS: Cardiovascular: Aortic atherosclerosis. Borderline cardiomegaly, without pericardial effusion. Left main and 3 vessel coronary artery calcification. Aortic valve calcification. Pulmonary artery enlargement, outflow tract 3.1 cm. Mediastinum/Nodes: Thyroid enlargement and heterogeneity. This has been evaluated on previous imaging. (ref: J Am Coll Radiol. 2015 Feb;12(2): 143-50). No mediastinal or hilar adenopathy, given limitations of unenhanced CT. Lungs/Pleura: No pleural fluid. Right lower lobe  scarring laterally with right hemidiaphragm elevation. Tiny subpleural lymph nodes again identified along the left major fissure. No suspicious pulmonary nodule or mass. Upper Abdomen: Normal imaged portions of the liver, spleen, stomach, pancreas, gallbladder, adrenal glands, kidneys. Musculoskeletal: Diffuse idiopathic skeletal hyperostosis throughout the thoracic spine. 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 The sternal body primarily lytic lesion measures 3.0 x 2.0 cm on 135/6 versus similar (when remeasured). Sharp transition, without aggressive features. Similar trabeculation within the inferior portion. IMPRESSION: 1. Similar size and morphology of a sternal body lesion over nearly 6 months. Given morphology and stability, favored to represent a hemangioma . In the absence of clinically indicated signs/symptoms require(s) no independent follow-up. 2.  No acute process in the chest. 3. Pulmonary artery enlargement suggests pulmonary arterial hypertension. 4. Aortic valvular calcifications. Consider echocardiography to evaluate for valvular dysfunction. 5.  Coronary artery atherosclerosis. Aortic Atherosclerosis (ICD10-I70.0). Electronically Signed   By: Jeronimo Greaves M.D.   On: 03/01/2023 14:17    Assessment & Plan:  Type II diabetes mellitus with manifestations (HCC)- A1C is up to 8.5%. Will start metformin and an SGLT-2 inh. -     Basic metabolic panel; Future -     Hemoglobin A1c; Future -     Synjardy; Take 1 tablet by mouth 2 (two) times daily.  Dispense: 200 tablet; Refill: 0  Primary hypertension- BP is well controlled. EKG is negative for LVH. -     Basic metabolic panel; Future -     CBC with Differential/Platelet; Future -     TSH; Future -     EKG 12-Lead  Hyperlipidemia LDL goal <70- LDL goal achieved. Doing well on the statin  -     TSH; Future     Follow-up: Return in about 6 months (around 10/11/2023).  Sanda Linger, MD

## 2023-04-13 NOTE — Patient Instructions (Signed)
 Hypertension, Adult High blood pressure (hypertension) is when the force of blood pumping through the arteries is too strong. The arteries are the blood vessels that carry blood from the heart throughout the body. Hypertension forces the heart to work harder to pump blood and may cause arteries to become narrow or stiff. Untreated or uncontrolled hypertension can lead to a heart attack, heart failure, a stroke, kidney disease, and other problems. A blood pressure reading consists of a higher number over a lower number. Ideally, your blood pressure should be below 120/80. The first ("top") number is called the systolic pressure. It is a measure of the pressure in your arteries as your heart beats. The second ("bottom") number is called the diastolic pressure. It is a measure of the pressure in your arteries as the heart relaxes. What are the causes? The exact cause of this condition is not known. There are some conditions that result in high blood pressure. What increases the risk? Certain factors may make you more likely to develop high blood pressure. Some of these risk factors are under your control, including: Smoking. Not getting enough exercise or physical activity. Being overweight. Having too much fat, sugar, calories, or salt (sodium) in your diet. Drinking too much alcohol. Other risk factors include: Having a personal history of heart disease, diabetes, high cholesterol, or kidney disease. Stress. Having a family history of high blood pressure and high cholesterol. Having obstructive sleep apnea. Age. The risk increases with age. What are the signs or symptoms? High blood pressure may not cause symptoms. Very high blood pressure (hypertensive crisis) may cause: Headache. Fast or irregular heartbeats (palpitations). Shortness of breath. Nosebleed. Nausea and vomiting. Vision changes. Severe chest pain, dizziness, and seizures. How is this diagnosed? This condition is diagnosed by  measuring your blood pressure while you are seated, with your arm resting on a flat surface, your legs uncrossed, and your feet flat on the floor. The cuff of the blood pressure monitor will be placed directly against the skin of your upper arm at the level of your heart. Blood pressure should be measured at least twice using the same arm. Certain conditions can cause a difference in blood pressure between your right and left arms. If you have a high blood pressure reading during one visit or you have normal blood pressure with other risk factors, you may be asked to: Return on a different day to have your blood pressure checked again. Monitor your blood pressure at home for 1 week or longer. If you are diagnosed with hypertension, you may have other blood or imaging tests to help your health care provider understand your overall risk for other conditions. How is this treated? This condition is treated by making healthy lifestyle changes, such as eating healthy foods, exercising more, and reducing your alcohol intake. You may be referred for counseling on a healthy diet and physical activity. Your health care provider may prescribe medicine if lifestyle changes are not enough to get your blood pressure under control and if: Your systolic blood pressure is above 130. Your diastolic blood pressure is above 80. Your personal target blood pressure may vary depending on your medical conditions, your age, and other factors. Follow these instructions at home: Eating and drinking  Eat a diet that is high in fiber and potassium, and low in sodium, added sugar, and fat. An example of this eating plan is called the DASH diet. DASH stands for Dietary Approaches to Stop Hypertension. To eat this way: Eat  plenty of fresh fruits and vegetables. Try to fill one half of your plate at each meal with fruits and vegetables. Eat whole grains, such as whole-wheat pasta, brown rice, or whole-grain bread. Fill about one  fourth of your plate with whole grains. Eat or drink low-fat dairy products, such as skim milk or low-fat yogurt. Avoid fatty cuts of meat, processed or cured meats, and poultry with skin. Fill about one fourth of your plate with lean proteins, such as fish, chicken without skin, beans, eggs, or tofu. Avoid pre-made and processed foods. These tend to be higher in sodium, added sugar, and fat. Reduce your daily sodium intake. Many people with hypertension should eat less than 1,500 mg of sodium a day. Do not drink alcohol if: Your health care provider tells you not to drink. You are pregnant, may be pregnant, or are planning to become pregnant. If you drink alcohol: Limit how much you have to: 0-1 drink a day for women. 0-2 drinks a day for men. Know how much alcohol is in your drink. In the [place] one drink equals one 12 oz bottle of beer (355 mL), one 5 oz glass of wine (148 mL), or one 1 oz glass of hard liquor (44 mL). Lifestyle  Work with your health care provider to maintain a healthy body weight or to lose weight. Ask what an ideal weight is for you. Get at least 30 minutes of exercise that causes your heart to beat faster (aerobic exercise) most days of the week. Activities may include walking, swimming, or biking. Include exercise to strengthen your muscles (resistance exercise), such as Pilates or lifting weights, as part of your weekly exercise routine. Try to do these types of exercises for 30 minutes at least 3 days a week. Do not use any products that contain nicotine or tobacco. These products include cigarettes, chewing tobacco, and vaping devices, such as e-cigarettes. If you need help quitting, ask your health care provider. Monitor your blood pressure at home as told by your health care provider. Keep all follow-up visits. This is important. Medicines Take over-the-counter and prescription medicines only as told by your health care provider. Follow directions carefully. Blood  pressure medicines must be taken as prescribed. Do not skip doses of blood pressure medicine. Doing this puts you at risk for problems and can make the medicine less effective. Ask your health care provider about side effects or reactions to medicines that you should watch for. Contact a health care provider if you: Think you are having a reaction to a medicine you are taking. Have headaches that keep coming back (recurring). Feel dizzy. Have swelling in your ankles. Have trouble with your vision. Get help right away if you: Develop a severe headache or confusion. Have unusual weakness or numbness. Feel faint. Have severe pain in your chest or abdomen. Vomit repeatedly. Have trouble breathing. These symptoms may be an emergency. Get help right away. Call 911. Do not wait to see if the symptoms will go away. Do not drive yourself to the hospital. Summary Hypertension is when the force of blood pumping through your arteries is too strong. If this condition is not controlled, it may put you at risk for serious complications. Your personal target blood pressure may vary depending on your medical conditions, your age, and other factors. For most people, a normal blood pressure is less than 120/80. Hypertension is treated with lifestyle changes, medicines, or a combination of both. Lifestyle changes include losing weight, eating a healthy,  low-sodium diet, exercising more, and limiting alcohol. This information is not intended to replace advice given to you by your health care provider. Make sure you discuss any questions you have with your health care provider. Document Revised: 12/23/2020 Document Reviewed: 12/23/2020 Elsevier Patient Education  2024 [employer].

## 2023-04-14 LAB — HEMOGLOBIN A1C: Hgb A1c MFr Bld: 8.5 % — ABNORMAL HIGH (ref 4.6–6.5)

## 2023-04-14 MED ORDER — SYNJARDY 5-1000 MG PO TABS: 1.0000 | ORAL_TABLET | Freq: Two times a day (BID) | ORAL | 0 refills | Status: DC

## 2023-04-15 ENCOUNTER — Encounter: Payer: Self-pay | Admitting: Internal Medicine

## 2023-04-19 ENCOUNTER — Other Ambulatory Visit: Payer: Self-pay | Admitting: Internal Medicine

## 2023-04-19 DIAGNOSIS — E118 Type 2 diabetes mellitus with unspecified complications: Secondary | ICD-10-CM

## 2023-04-21 MED ORDER — SYNJARDY 5-1000 MG PO TABS
1.0000 | ORAL_TABLET | Freq: Two times a day (BID) | ORAL | 1 refills | Status: DC
Start: 2023-04-21 — End: 2023-08-04

## 2023-05-18 DIAGNOSIS — H353231 Exudative age-related macular degeneration, bilateral, with active choroidal neovascularization: Secondary | ICD-10-CM | POA: Diagnosis not present

## 2023-05-26 ENCOUNTER — Other Ambulatory Visit: Payer: Self-pay | Admitting: Internal Medicine

## 2023-05-26 DIAGNOSIS — K5180 Other ulcerative colitis without complications: Secondary | ICD-10-CM

## 2023-06-23 ENCOUNTER — Other Ambulatory Visit: Payer: Self-pay | Admitting: Internal Medicine

## 2023-06-23 DIAGNOSIS — I10 Essential (primary) hypertension: Secondary | ICD-10-CM

## 2023-06-28 ENCOUNTER — Ambulatory Visit (INDEPENDENT_AMBULATORY_CARE_PROVIDER_SITE_OTHER)

## 2023-06-28 VITALS — BP 132/68 | HR 68 | Ht 71.0 in | Wt 190.4 lb

## 2023-06-28 DIAGNOSIS — Z0000 Encounter for general adult medical examination without abnormal findings: Secondary | ICD-10-CM

## 2023-06-28 NOTE — Patient Instructions (Addendum)
 Bruce Sanchez , Thank you for taking time to come for your Medicare Wellness Visit. I appreciate your ongoing commitment to your health goals. Please review the following plan we discussed and let me know if I can assist you in the future.   Referrals/Orders/Follow-Ups/Clinician Recommendations: Aim for 30 minutes of exercise or brisk walking, 6-8 glasses of water, and 5 servings of fruits and vegetables each day.   This is a list of the screening recommended for you and due dates:  Health Maintenance  Topic Date Due   Eye exam for diabetics  08/11/2023   Yearly kidney health urinalysis for diabetes  08/18/2023   Complete foot exam   08/18/2023   Hemoglobin A1C  10/11/2023   Yearly kidney function blood test for diabetes  04/12/2024   Medicare Annual Wellness Visit  06/27/2024   Colon Cancer Screening  09/18/2030   DTaP/Tdap/Td vaccine (3 - Td or Tdap) 02/06/2032   Pneumonia Vaccine  Completed   Hepatitis C Screening  Completed   Zoster (Shingles) Vaccine  Completed   HPV Vaccine  Aged Out   Meningitis B Vaccine  Aged Out   Flu Shot  Discontinued   COVID-19 Vaccine  Discontinued    Advanced directives: (Provided) Advance directive discussed with you today. I have provided a copy for you to complete at home and have notarized. Once this is complete, please bring a copy in to our office so we can scan it into your chart.   Next Medicare Annual Wellness Visit scheduled for next year: Yes

## 2023-06-28 NOTE — Progress Notes (Signed)
 Subjective:   Bruce Sanchez is a 68 y.o. who presents for a Medicare Wellness preventive visit.  Visit Complete: In person  Persons Participating in Visit: Patient.  AWV Questionnaire: No: Patient Medicare AWV questionnaire was not completed prior to this visit.  Cardiac Risk Factors include: advanced age (>51men, >64 women);diabetes mellitus;dyslipidemia;hypertension;male gender     Objective:    Today's Vitals   06/28/23 1545  BP: 132/68  Pulse: 68  SpO2: 97%  Weight: 190 lb 6.4 oz (86.4 kg)  Height: 5\' 11"  (1.803 m)   Body mass index is 26.56 kg/m.     06/28/2023    3:43 PM 02/05/2022   11:38 AM  Advanced Directives  Does Patient Have a Medical Advance Directive? No No  Would patient like information on creating a medical advance directive? Yes (MAU/Ambulatory/Procedural Areas - Information given) No - Patient declined    Current Medications (verified) Outpatient Encounter Medications as of 06/28/2023  Medication Sig   amLODipine  (NORVASC ) 10 MG tablet Take 1 tablet (10 mg total) by mouth daily.   hydrocortisone  (ANUSOL -HC) 2.5 % rectal cream Place 1 Application rectally 2 (two) times daily as needed for hemorrhoids or anal itching.   meloxicam  (MOBIC ) 7.5 MG tablet Take 1 tablet (7.5 mg total) by mouth daily.   metoprolol  succinate (TOPROL -XL) 50 MG 24 hr tablet TAKE 1 TABLET BY MOUTH DAILY WITH OR IMMEDIATELY FOLLOWING A MEAL   rosuvastatin  (CRESTOR ) 20 MG tablet TAKE 1 TABLET BY MOUTH DAILY   sulfaSALAzine  (AZULFIDINE ) 500 MG tablet TAKE TWO TABLETS BY MOUTH FOUR TIMES A DAY   dicyclomine (BENTYL) 10 MG capsule Take by mouth.   Empagliflozin -metFORMIN HCl (SYNJARDY ) 06-998 MG TABS Take 1 tablet by mouth 2 (two) times daily. (Patient not taking: Reported on 06/28/2023)   No facility-administered encounter medications on file as of 06/28/2023.    Allergies (verified) Trulicity [dulaglutide] and Olmesartan    History: Past Medical History:  Diagnosis Date    Colitis    Diabetes mellitus without complication (HCC)    Hyperlipidemia    Hypertension    Ulcerative colitis (HCC)    Past Surgical History:  Procedure Laterality Date   HERNIA REPAIR     WISDOM TOOTH EXTRACTION     Family History  Problem Relation Age of Onset   Diabetes Mother    Arthritis Mother    Heart attack Mother    Heart disease Father    Heart attack Father    Diabetes Brother    Stomach cancer Neg Hx    Colon cancer Neg Hx    Esophageal cancer Neg Hx    Social History   Socioeconomic History   Marital status: Married    Spouse name: Not on file   Number of children: Not on file   Years of education: Not on file   Highest education level: Not on file  Occupational History   Occupation: chef  Tobacco Use   Smoking status: Never    Passive exposure: Never   Smokeless tobacco: Never  Vaping Use   Vaping status: Never Used  Substance and Sexual Activity   Alcohol use: Yes    Alcohol/week: 7.0 standard drinks of alcohol    Types: 7 Glasses of wine per week    Comment: occassionally   Drug use: Never   Sexual activity: Yes    Partners: Female  Other Topics Concern   Not on file  Social History Narrative   Married   Social Drivers of Dispensing optician  Resource Strain: Low Risk  (06/28/2023)   Overall Financial Resource Strain (CARDIA)    Difficulty of Paying Living Expenses: Not hard at all  Food Insecurity: No Food Insecurity (06/28/2023)   Hunger Vital Sign    Worried About Running Out of Food in the Last Year: Never true    Ran Out of Food in the Last Year: Never true  Transportation Needs: No Transportation Needs (06/28/2023)   PRAPARE - Administrator, Civil Service (Medical): No    Lack of Transportation (Non-Medical): No  Physical Activity: Sufficiently Active (06/28/2023)   Exercise Vital Sign    Days of Exercise per Week: 7 days    Minutes of Exercise per Session: 60 min  Stress: No Stress Concern Present (06/28/2023)    [employer] of Occupational Health - Occupational Stress Questionnaire    Feeling of Stress : Not at all  Social Connections: Moderately Integrated (06/28/2023)   Social Connection and Isolation Panel [NHANES]    Frequency of Communication with Friends and Family: More than three times a week    Frequency of Social Gatherings with Friends and Family: More than three times a week    Attends Religious Services: Never    Database administrator or Organizations: Yes    Attends Engineer, structural: 1 to 4 times per year    Marital Status: Married    Tobacco Counseling Counseling given: No    Clinical Intake:  Pre-visit preparation completed: Yes  Pain : No/denies pain     BMI - recorded: 26.56 Nutritional Status: BMI 25 -29 Overweight Nutritional Risks: None Diabetes: Yes CBG done?: No CBG resulted in Enter/ Edit results?: No Did pt. bring in CBG monitor from home?: No  Lab Results  Component Value Date   HGBA1C 8.5 (H) 04/13/2023   HGBA1C 5.9 08/18/2022   HGBA1C 5.5 02/10/2022     How often do you need to have someone help you when you read instructions, pamphlets, or other written materials from your doctor or pharmacy?: 1 - Never  Interpreter Needed?: No  Information entered by :: Kandy Orris, CMA   Activities of Daily Living     06/28/2023    3:46 PM  In your present state of health, do you have any difficulty performing the following activities:  Hearing? 0  Vision? 0  Difficulty concentrating or making decisions? 0  Walking or climbing stairs? 0  Dressing or bathing? 0  Doing errands, shopping? 0  Preparing Food and eating ? N  Using the Toilet? N  In the past six months, have you accidently leaked urine? N  Do you have problems with loss of bowel control? Y  Comment wears a depend  Managing your Medications? N  Managing your Finances? N  Housekeeping or managing your Housekeeping? N    Patient Care Team: Arcadio Knuckles, MD as PCP  - General (Internal Medicine) Eilleen Grates, MD as PCP - [facility] as Consulting Physician (Ophthalmology) Chancey Combe Linette Rias, MD as Consulting Physician (Ophthalmology)  Indicate any recent Medical Services you may have received from other than [facility] providers in the past year (date may be approximate).     Assessment:   This is a routine wellness examination for Bruce Sanchez.  Hearing/Vision screen Hearing Screening - Comments:: Denies hearing difficulties   Vision Screening - Comments:: Wears rx glasses - up to date with routine eye exams with Dr Chancey Combe   Goals Addressed  This Visit's Progress     Patient Stated (pt-stated)        Patient stated he plans to staying active.       Depression Screen     06/28/2023    3:56 PM 04/13/2023    3:17 PM 01/12/2023    3:37 PM 03/31/2022    3:21 PM 05/06/2021    9:32 AM  PHQ 2/9 Scores  PHQ - 2 Score 0 0 0 0 0  PHQ- 9 Score 0        Fall Risk     06/28/2023    3:53 PM 04/13/2023    3:17 PM 01/12/2023    3:37 PM 03/31/2022    3:25 PM  Fall Risk   Falls in the past year? 0 0 0 0  Number falls in past yr: 0 0 0 0  Injury with Fall? 0 0 0 0  Risk for fall due to : No Fall Risks No Fall Risks No Fall Risks No Fall Risks  Follow up Falls prevention discussed;Falls evaluation completed Falls evaluation completed Falls evaluation completed Falls prevention discussed    MEDICARE RISK AT HOME:  Medicare Risk at Home Any stairs in or around the home?: No If so, are there any without handrails?: No Home free of loose throw rugs in walkways, pet beds, electrical cords, etc?: Yes Adequate lighting in your home to reduce risk of falls?: Yes Life alert?: No Use of a cane, walker or w/c?: No Grab bars in the bathroom?: No Shower chair or bench in shower?: No Elevated toilet seat or a handicapped toilet?: No  TIMED UP AND GO:  Was the test performed?  No  Cognitive Function: 6CIT  completed        06/28/2023    4:00 PM 03/31/2022    2:46 PM  6CIT Screen  What Year? 0 points 0 points  What month? 0 points 0 points  What time? 0 points 0 points  Count back from 20 0 points 0 points  Months in reverse 0 points 0 points  Repeat phrase 4 points 0 points  Total Score 4 points 0 points    Immunizations Immunization History  Administered Date(s) Administered   PFIZER(Purple Top)SARS-COV-2 Vaccination 05/02/2019, 05/30/2019   PNEUMOCOCCAL CONJUGATE-20 08/01/2020   Tdap 02/05/2022, 02/05/2022   Zoster Recombinant(Shingrix ) 12/24/2020, 05/26/2021    Screening Tests Health Maintenance  Topic Date Due   OPHTHALMOLOGY EXAM  08/11/2023   Diabetic kidney evaluation - Urine ACR  08/18/2023   FOOT EXAM  08/18/2023   HEMOGLOBIN A1C  10/11/2023   Diabetic kidney evaluation - eGFR measurement  04/12/2024   Medicare Annual Wellness (AWV)  06/27/2024   Colonoscopy  09/18/2030   DTaP/Tdap/Td (3 - Td or Tdap) 02/06/2032   Pneumonia Vaccine 54+ Years old  Completed   Hepatitis C Screening  Completed   Zoster Vaccines- Shingrix   Completed   HPV VACCINES  Aged Out   Meningococcal B Vaccine  Aged Out   INFLUENZA VACCINE  Discontinued   COVID-19 Vaccine  Discontinued    Health Maintenance  There are no preventive care reminders to display for this patient. Health Maintenance Items Addressed:06/28/2023   Additional Screening:  Vision Screening: Recommended annual ophthalmology exams for early detection of glaucoma and other disorders of the eye.  Pt stated has an appt tomorrow for eye exam.  Dental Screening: Recommended annual dental exams for proper oral hygiene  Community Resource Referral / Chronic Care Management: CRR required this visit?  No  CCM required this visit?  No     Plan:     I have personally reviewed and noted the following in the patient's chart:   Medical and social history Use of alcohol, tobacco or illicit drugs  Current medications  and supplements including opioid prescriptions. Patient is not currently taking opioid prescriptions. Functional ability and status Nutritional status Physical activity Advanced directives List of other physicians Hospitalizations, surgeries, and ER visits in previous 12 months Vitals Screenings to include cognitive, depression, and falls Referrals and appointments  In addition, I have reviewed and discussed with patient certain preventive protocols, quality metrics, and best practice recommendations. A written personalized care plan for preventive services as well as general preventive health recommendations were provided to patient.     Patria Bookbinder, CMA   06/28/2023   After Visit Summary: (In Person-Declined) Patient declined AVS at this time.  Notes: Nothing significant to report at this time.

## 2023-06-29 DIAGNOSIS — H353231 Exudative age-related macular degeneration, bilateral, with active choroidal neovascularization: Secondary | ICD-10-CM | POA: Diagnosis not present

## 2023-07-08 ENCOUNTER — Telehealth: Payer: Self-pay

## 2023-07-08 NOTE — Telephone Encounter (Signed)
 Call to speak with the patient in regards to his Glucometer that he requested and to possibly help with the price of his Synjardy . Was unable to reach patient. LMTRC

## 2023-07-11 ENCOUNTER — Other Ambulatory Visit (INDEPENDENT_AMBULATORY_CARE_PROVIDER_SITE_OTHER): Payer: Self-pay | Admitting: Pharmacist

## 2023-07-11 DIAGNOSIS — E118 Type 2 diabetes mellitus with unspecified complications: Secondary | ICD-10-CM

## 2023-07-11 MED ORDER — LANCET DEVICE MISC
1.0000 | Freq: Three times a day (TID) | 0 refills | Status: AC
Start: 2023-07-11 — End: 2023-08-10

## 2023-07-11 MED ORDER — LANCETS MISC. MISC
0 refills | Status: AC
Start: 2023-07-11 — End: ?

## 2023-07-11 MED ORDER — BLOOD GLUCOSE TEST VI STRP
ORAL_STRIP | 11 refills | Status: AC
Start: 2023-07-11 — End: ?

## 2023-07-11 MED ORDER — BLOOD GLUCOSE MONITORING SUPPL DEVI
1.0000 | Freq: Three times a day (TID) | 0 refills | Status: AC
Start: 2023-07-11 — End: ?

## 2023-07-11 NOTE — Progress Notes (Signed)
   07/11/2023 Name: Bruce Sanchez MRN: [id number] DOB: [birth year]  Chief Complaint  Patient presents with   Medication Access   S/O Bruce Sanchez is a 68 y.o. year old male who presented for a telephone visit. Spoke with patient's wife, Bruce Sanchez. She noted she had not heard about a request she and pt made regarding getting a new glucometer as his no longer is working. She also notes the Synjardy  Pcp prescribed was $300+ when they tried to get it filled.   A/P - Will send Rx for new glucometer supplies - Reviewed income eligibility for Synjardy  PAP thorugh [employer], should meet requirements. Will ask MAP team to fill out app and mail to pt's home. Pt's wife will then drop it off at the office once signed.  Rainelle Bur, PharmD, BCPS, CPP Clinical Pharmacist Practitioner [facility] [phone]

## 2023-07-11 NOTE — Patient Instructions (Signed)
 It was a pleasure speaking with you today!  Glucometer supplies sent to [employer].  The Medication Assistance Team will start the Synjardy  patient assistance application and mail it to you. Please bring it by the office when it has been signed.  Feel free to call with any questions or concerns!  Rainelle Bur, PharmD, BCPS, CPP Clinical Pharmacist Practitioner [facility] [phone]

## 2023-07-13 ENCOUNTER — Telehealth: Payer: Self-pay

## 2023-07-13 NOTE — Telephone Encounter (Signed)
 Gave pt a call to let him know,I will be mailing out application for Synjardy  ([employer]),spoke with pt he is aware he will be receiving application via mail next week, I have also fax provider portion today.

## 2023-07-19 ENCOUNTER — Ambulatory Visit: Admitting: Gastroenterology

## 2023-07-26 NOTE — Telephone Encounter (Signed)
 Gave pt a call to follow up on PAP [employer] left a HIPAA VM.

## 2023-08-03 ENCOUNTER — Encounter: Payer: Self-pay | Admitting: Internal Medicine

## 2023-08-03 ENCOUNTER — Ambulatory Visit: Payer: Medicare Other | Admitting: Internal Medicine

## 2023-08-03 VITALS — BP 142/74 | HR 81 | Temp 98.1°F | Resp 16 | Ht 71.0 in | Wt 192.0 lb

## 2023-08-03 DIAGNOSIS — I10 Essential (primary) hypertension: Secondary | ICD-10-CM

## 2023-08-03 DIAGNOSIS — I350 Nonrheumatic aortic (valve) stenosis: Secondary | ICD-10-CM | POA: Diagnosis not present

## 2023-08-03 DIAGNOSIS — Z7984 Long term (current) use of oral hypoglycemic drugs: Secondary | ICD-10-CM

## 2023-08-03 DIAGNOSIS — E042 Nontoxic multinodular goiter: Secondary | ICD-10-CM | POA: Diagnosis not present

## 2023-08-03 DIAGNOSIS — K5180 Other ulcerative colitis without complications: Secondary | ICD-10-CM

## 2023-08-03 DIAGNOSIS — E785 Hyperlipidemia, unspecified: Secondary | ICD-10-CM | POA: Diagnosis not present

## 2023-08-03 DIAGNOSIS — E118 Type 2 diabetes mellitus with unspecified complications: Secondary | ICD-10-CM

## 2023-08-03 LAB — MICROALBUMIN / CREATININE URINE RATIO
Creatinine,U: 107 mg/dL
Microalb Creat Ratio: 581.7 mg/g — ABNORMAL HIGH (ref 0.0–30.0)
Microalb, Ur: 62.2 mg/dL — ABNORMAL HIGH (ref 0.0–1.9)

## 2023-08-03 LAB — LIPID PANEL
Cholesterol: 115 mg/dL (ref 0–200)
HDL: 39.8 mg/dL (ref >39.00)
LDL Cholesterol: 42 mg/dL (ref 0–99)
NonHDL: 75.5
Total CHOL/HDL Ratio: 3
Triglycerides: 170 mg/dL — ABNORMAL HIGH (ref 0.0–149.0)
VLDL: 34 mg/dL (ref 0.0–40.0)

## 2023-08-03 LAB — BASIC METABOLIC PANEL WITH GFR
BUN: 13 mg/dL (ref 6–23)
CO2: 32 meq/L (ref 19–32)
Calcium: 9.2 mg/dL (ref 8.4–10.5)
Chloride: 99 meq/L (ref 96–112)
Creatinine, Ser: 0.64 mg/dL (ref 0.40–1.50)
GFR: 96.24 mL/min (ref >60.00)
Glucose, Bld: 242 mg/dL — ABNORMAL HIGH (ref 70–99)
Potassium: 3.7 meq/L (ref 3.5–5.1)
Sodium: 137 meq/L (ref 135–145)

## 2023-08-03 LAB — HEPATIC FUNCTION PANEL
ALT: 23 U/L (ref 0–53)
AST: 21 U/L (ref 0–37)
Albumin: 4.4 g/dL (ref 3.5–5.2)
Alkaline Phosphatase: 72 U/L (ref 39–117)
Bilirubin, Direct: 0.1 mg/dL (ref 0.0–0.3)
Total Bilirubin: 0.7 mg/dL (ref 0.2–1.2)
Total Protein: 6.7 g/dL (ref 6.0–8.3)

## 2023-08-03 LAB — HEMOGLOBIN A1C: Hgb A1c MFr Bld: 8.1 % — ABNORMAL HIGH (ref 4.6–6.5)

## 2023-08-03 LAB — TSH: TSH: 1.15 u[IU]/mL (ref 0.35–5.50)

## 2023-08-03 MED ORDER — PRAMOXINE HCL (PERIANAL) 1 % EX FOAM: 1.0000 | Freq: Three times a day (TID) | CUTANEOUS | 2 refills | Status: DC | PRN

## 2023-08-03 MED ORDER — SULFASALAZINE 500 MG PO TABS
1000.0000 mg | ORAL_TABLET | Freq: Four times a day (QID) | ORAL | 0 refills | Status: DC
Start: 2023-08-03 — End: 2023-10-05

## 2023-08-03 NOTE — Progress Notes (Signed)
 Subjective:  Patient ID: Bruce Sanchez, male    DOB: Mar 15, 1955  Age: 68 y.o. MRN: [id number]  CC: Medical Management of Chronic Issues (A1c check, labs, not currently taking anything for diabetes. Issues with internal hemorrhoids since may 9th (pain, intermittent bleeding). Currently treating with hydrocortisone  cream and meloxicam . Would also like extended refill on azulfidine  if possible)   HPI Bruce Sanchez presents for f/up ---  Discussed the use of AI scribe software for clinical note transcription with the patient, who gave verbal consent to proceed.  History of Present Illness   Bruce Sanchez is a 68 year old male who presents with elevated blood sugar levels.  He has been monitoring his blood sugar levels using a new OneTouch monitor and noted a reading of 270 mg/dL this morning. He is uncertain if the device is programmed correctly.  He has not been taking any medication for blood sugar control recently due to the cost.  No excessive thirst, drastic changes in weight or appetite, chest pain, shortness of breath, or significant dizziness. He reports frequent urination, especially at night, which he attributes to drinking water before bed. He also mentions feeling different or possibly lightheaded when his blood sugar is high.       Outpatient Medications Prior to Visit  Medication Sig Dispense Refill   amLODipine  (NORVASC ) 10 MG tablet Take 1 tablet (10 mg total) by mouth daily. 90 tablet 3   Blood Glucose Monitoring Suppl DEVI 1 each by Does not apply route in the morning, at noon, and at bedtime. May substitute to any manufacturer covered by patient's insurance. 1 each 0   Glucose Blood (BLOOD GLUCOSE TEST STRIPS) STRP Use to check blood glucose up to 3 times daily. May substitute to any manufacturer covered by patient's insurance. 100 strip 11   hydrocortisone  (ANUSOL -HC) 2.5 % rectal cream Place 1 Application rectally 2 (two) times daily as needed for hemorrhoids or anal  itching. 30 g 3   Lancet Device MISC 1 each by Does not apply route in the morning, at noon, and at bedtime. May substitute to any manufacturer covered by patient's insurance. 1 each 0   Lancets Misc. MISC Use to check blood glucose up to 3 times daily. May substitute to any manufacturer covered by patient's insurance. 100 each 0   metoprolol  succinate (TOPROL -XL) 50 MG 24 hr tablet TAKE ONE TABLET BY MOUTH DAILY WITH OR IMMEDIATELY FOLLOWING A MEAL 90 tablet 0   rosuvastatin  (CRESTOR ) 20 MG tablet TAKE 1 TABLET BY MOUTH DAILY 90 tablet 1   Empagliflozin -metFORMIN  HCl (SYNJARDY ) 06-998 MG TABS Take 1 tablet by mouth 2 (two) times daily. 180 tablet 1   meloxicam  (MOBIC ) 7.5 MG tablet Take 1 tablet (7.5 mg total) by mouth daily. 90 tablet 0   sulfaSALAzine  (AZULFIDINE ) 500 MG tablet TAKE TWO TABLETS BY MOUTH FOUR TIMES A DAY 720 tablet 0   dicyclomine (BENTYL) 10 MG capsule Take by mouth.     No facility-administered medications prior to visit.    ROS Review of Systems  Constitutional:  Negative for appetite change, chills, diaphoresis, fatigue and fever.  HENT: Negative.  Negative for trouble swallowing.   Respiratory: Negative.  Negative for chest tightness, shortness of breath and wheezing.   Cardiovascular:  Negative for chest pain, palpitations and leg swelling.  Gastrointestinal:  Positive for anal bleeding and blood in stool. Negative for abdominal pain, constipation, diarrhea, nausea and vomiting.  Endocrine: Positive for polyphagia and polyuria. Negative for polydipsia.  Genitourinary: Negative.  Musculoskeletal: Negative.  Negative for myalgias.  Skin: Negative.  Negative for color change.  Neurological: Negative.  Negative for dizziness, weakness and light-headedness.  Hematological:  Negative for adenopathy. Does not bruise/bleed easily.  Psychiatric/Behavioral: Negative.      Objective:  BP (!) 142/74 (BP Location: Right Arm, Patient Position: Sitting, Cuff Size: Normal)    Pulse 81   Temp 98.1 F (36.7 C) (Oral)   Resp 16   Ht 5\' 11"  (1.803 m)   Wt 192 lb (87.1 kg)   SpO2 95%   BMI 26.78 kg/m   BP Readings from Last 3 Encounters:  08/03/23 (!) 142/74  06/28/23 132/68  04/13/23 (!) 140/72    Wt Readings from Last 3 Encounters:  08/03/23 192 lb (87.1 kg)  06/28/23 190 lb 6.4 oz (86.4 kg)  04/13/23 195 lb (88.5 kg)    Physical Exam Vitals reviewed.  Constitutional:      Appearance: Normal appearance.  HENT:     Nose: Nose normal.     Mouth/Throat:     Mouth: Mucous membranes are moist.  Eyes:     General: No scleral icterus.    Conjunctiva/sclera: Conjunctivae normal.  Neck:     Thyroid : Thyromegaly present. No thyroid  mass or thyroid  tenderness.  Cardiovascular:     Rate and Rhythm: Normal rate and regular rhythm.     Heart sounds: Murmur heard.     Systolic murmur is present with a grade of 2/6.     No diastolic murmur is present.     No friction rub. No gallop.  Pulmonary:     Effort: Pulmonary effort is normal.     Breath sounds: No stridor. No wheezing, rhonchi or rales.  Abdominal:     General: Abdomen is flat.     Palpations: There is no mass.     Tenderness: There is no abdominal tenderness. There is no guarding.     Hernia: No hernia is present.  Musculoskeletal:        General: Normal range of motion.     Cervical back: Neck supple.     Right lower leg: No edema.     Left lower leg: No edema.  Lymphadenopathy:     Cervical: No cervical adenopathy.  Skin:    General: Skin is warm and dry.     Findings: No rash.  Neurological:     General: No focal deficit present.     Mental Status: He is alert. Mental status is at baseline.  Psychiatric:        Mood and Affect: Mood normal.        Behavior: Behavior normal.     Lab Results  Component Value Date   WBC 5.9 04/13/2023   HGB 15.8 04/13/2023   HCT 47.9 04/13/2023   PLT 184.0 04/13/2023   GLUCOSE 242 (H) 08/03/2023   CHOL 115 08/03/2023   TRIG 170.0 (H)  08/03/2023   HDL 39.80 08/03/2023   LDLDIRECT 126.0 08/18/2022   LDLCALC 42 08/03/2023   ALT 23 08/03/2023   AST 21 08/03/2023   NA 137 08/03/2023   K 3.7 08/03/2023   CL 99 08/03/2023   CREATININE 0.64 08/03/2023   BUN 13 08/03/2023   CO2 32 08/03/2023   TSH 1.15 08/03/2023   PSA 1.13 08/18/2022   HGBA1C 8.1 (H) 08/03/2023   MICROALBUR 62.2 (H) 08/03/2023    CT Chest Wo Contrast Result Date: 03/01/2023 CLINICAL DATA:  Follow-up of sternal lesion. EXAM: CT CHEST WITHOUT CONTRAST TECHNIQUE:  Multidetector CT imaging of the chest was performed following the standard protocol without IV contrast. RADIATION DOSE REDUCTION: This exam was performed according to the departmental dose-optimization program which includes automated exposure control, adjustment of the mA and/or kV according to patient size and/or use of iterative reconstruction technique. COMPARISON:  08/31/2022 FINDINGS: Cardiovascular: Aortic atherosclerosis. Borderline cardiomegaly, without pericardial effusion. Left main and 3 vessel coronary artery calcification. Aortic valve calcification. Pulmonary artery enlargement, outflow tract 3.1 cm. Mediastinum/Nodes: Thyroid  enlargement and heterogeneity. This has been evaluated on previous imaging. (ref: J Am Coll Radiol. 2015 Feb;12(2): 143-50). No mediastinal or hilar adenopathy, given limitations of unenhanced CT. Lungs/Pleura: No pleural fluid. Right lower lobe scarring laterally with right hemidiaphragm elevation. Tiny subpleural lymph nodes again identified along the left major fissure. No suspicious pulmonary nodule or mass. Upper Abdomen: Normal imaged portions of the liver, spleen, stomach, pancreas, gallbladder, adrenal glands, kidneys. Musculoskeletal: Diffuse idiopathic skeletal hyperostosis throughout the thoracic spine. 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 The sternal body primarily lytic lesion measures 3.0 x 2.0 cm on 135/6 versus similar (when remeasured). Sharp  transition, without aggressive features. Similar trabeculation within the inferior portion. IMPRESSION: 1. Similar size and morphology of a sternal body lesion over nearly 6 months. Given morphology and stability, favored to represent a hemangioma . In the absence of clinically indicated signs/symptoms require(s) no independent follow-up. 2.  No acute process in the chest. 3. Pulmonary artery enlargement suggests pulmonary arterial hypertension. 4. Aortic valvular calcifications. Consider echocardiography to evaluate for valvular dysfunction. 5. Coronary artery atherosclerosis. Aortic Atherosclerosis (ICD10-I70.0). Electronically Signed   By: Lore Rode M.D.   On: 03/01/2023 14:17    Assessment & Plan:  Type II diabetes mellitus with manifestations (HCC)- A1C is 8.1%. Will start metformin  and an SGLT-2 inh. -     Microalbumin / creatinine urine ratio; Future -     Basic metabolic panel with GFR; Future -     Urinalysis, Routine w reflex microscopic; Future -     Hemoglobin A1c; Future -     HM Diabetes Foot Exam -     metFORMIN  HCl ER; Take 1 tablet (750 mg total) by mouth daily with breakfast.  Dispense: 90 tablet; Refill: 0 -     Dapagliflozin  Propanediol; Take 1 tablet (10 mg total) by mouth daily before breakfast.  Dispense: 90 tablet; Refill: 0  Primary hypertension- His BP is adequately well controlled.  -     Basic metabolic panel with GFR; Future -     Urinalysis, Routine w reflex microscopic; Future -     Hepatic function panel; Future -     TSH; Future  Hyperlipidemia LDL goal <70- LDL goal achieved. Doing well on the statin  -     Lipid panel; Future -     Hepatic function panel; Future -     TSH; Future  Other ulcerative colitis without complication (HCC) -     sulfaSALAzine ; Take 2 tablets (1,000 mg total) by mouth 4 (four) times daily.  Dispense: 720 tablet; Refill: 0 -     Pramoxine HCl (Perianal); Place 1 Application rectally 3 (three) times daily as needed for anal  itching.  Dispense: 15 g; Refill: 2  Multiple thyroid  nodules -     TSH; Future  Aortic stenosis, mild -     Ambulatory referral to Cardiology     Follow-up: Return in about 3 months (around 11/03/2023).  Sandra Crouch, MD

## 2023-08-03 NOTE — Patient Instructions (Signed)

## 2023-08-04 LAB — URINALYSIS, ROUTINE W REFLEX MICROSCOPIC
Bilirubin Urine: NEGATIVE
Hgb urine dipstick: NEGATIVE
Leukocytes,Ua: NEGATIVE
Nitrite: NEGATIVE
RBC / HPF: NONE SEEN (ref 0–?)
Specific Gravity, Urine: 1.025 (ref 1.000–1.030)
Total Protein, Urine: 100 — AB
Urine Glucose: >=1000 — AB
Urobilinogen, UA: 1 (ref 0.0–1.0)
pH: 6 (ref 5.0–8.0)

## 2023-08-04 MED ORDER — DAPAGLIFLOZIN PROPANEDIOL 10 MG PO TABS: 10.0000 mg | ORAL_TABLET | Freq: Every day | ORAL | 0 refills | Status: DC

## 2023-08-04 MED ORDER — METFORMIN HCL ER 750 MG PO TB24: 750.0000 mg | ORAL_TABLET | Freq: Every day | ORAL | 0 refills | Status: DC

## 2023-08-05 ENCOUNTER — Ambulatory Visit: Payer: Self-pay | Admitting: Internal Medicine

## 2023-08-07 ENCOUNTER — Other Ambulatory Visit: Payer: Self-pay | Admitting: Internal Medicine

## 2023-08-07 DIAGNOSIS — M1811 Unilateral primary osteoarthritis of first carpometacarpal joint, right hand: Secondary | ICD-10-CM

## 2023-08-08 MED ORDER — ROSUVASTATIN CALCIUM 20 MG PO TABS: 20.0000 mg | ORAL_TABLET | Freq: Every day | ORAL | 1 refills | Status: DC

## 2023-08-09 NOTE — Telephone Encounter (Signed)
 Received pt portion of [employer] (synjardy ) application,waiting on provider portion to fax to [employer].

## 2023-08-17 NOTE — Telephone Encounter (Signed)
 Faxing provider portion due to don't receiving back, we have pt portion awaiting on provider portion to be fax to [employer].

## 2023-08-18 NOTE — Telephone Encounter (Signed)
 Received provider portion on PAP [employer] ([employer] ) and faxed to them,will follow up next week on pap status.

## 2023-08-23 NOTE — Telephone Encounter (Signed)
 Received APPROVAL letter from [facility] on SYNJARDY  thru 02/29/24, left HIPAA VM at pt number. Approval letter was index.

## 2023-08-24 ENCOUNTER — Telehealth: Payer: Self-pay | Admitting: Pharmacist

## 2023-08-24 DIAGNOSIS — E118 Type 2 diabetes mellitus with unspecified complications: Secondary | ICD-10-CM

## 2023-08-24 MED ORDER — SYNJARDY 5-1000 MG PO TABS: 1.0000 | ORAL_TABLET | Freq: Two times a day (BID) | ORAL | Status: DC

## 2023-08-24 NOTE — Telephone Encounter (Signed)
 Called patient to follow up on medications since I spoke to him on 5/14 regarding starting Synjardy  PAP and on 08/03/23 PCP prescribed metformin  and Farxiga . I had received notification that Synjardy  had been approved through PAP on 08/19/23.  Spoke to pt's wife, Carmela. Pt is currently taking metformin  however not Farxiga  due to cost. Notified Carmela that Synjardy  was approved. When he receives it in the mail, he will stop metformin  and start Synjardy  1 tablet twice daily.   Med list updated.  Darrelyn Drum, PharmD, BCPS, CPP Clinical Pharmacist Practitioner [facility] [phone]

## 2023-08-26 ENCOUNTER — Other Ambulatory Visit: Payer: Self-pay | Admitting: Internal Medicine

## 2023-08-26 DIAGNOSIS — K5180 Other ulcerative colitis without complications: Secondary | ICD-10-CM

## 2023-08-29 ENCOUNTER — Other Ambulatory Visit: Payer: Self-pay | Admitting: Internal Medicine

## 2023-08-29 DIAGNOSIS — E785 Hyperlipidemia, unspecified: Secondary | ICD-10-CM

## 2023-09-01 ENCOUNTER — Ambulatory Visit: Payer: Self-pay

## 2023-09-01 NOTE — Telephone Encounter (Signed)
 FYI Only or Action Required?: Action required by provider: clinical question for provider.  Patient was last seen in primary care on 08/03/2023 by Joshua Debby CROME, MD. Called Nurse Triage reporting Diarrhea. Symptoms began yesterday. Interventions attempted: OTC medications: Imodium. Symptoms are: stable.  Triage Disposition: Home Care  Patient/caregiver understands and will follow disposition?: YesCopied from CRM [id number]. Topic: Clinical - Red Word Triage >> Sep 01, 2023 11:37 AM Suzen RAMAN wrote: Red Word that prompted transfer to Nurse Triage: uncontrolled diarrhea possibly related to starting a medication Empagliflozin -metFORMIN  HCl (SYNJARDY ) 06-998 MG TABS per patient wife. Reason for Disposition  MILD-MODERATE diarrhea (e.g., 1-6 times / day more than normal)  Answer Assessment - Initial Assessment Questions 1. DIARRHEA SEVERITY: How bad is the diarrhea? How many more stools have you had in the past 24 hours than normal?    - NO DIARRHEA (SCALE 0)   - MILD (SCALE 1-3): Few loose or mushy BMs; increase of 1-3 stools over normal daily number of stools; mild increase in ostomy output.   -  MODERATE (SCALE 4-7): Increase of 4-6 stools daily over normal; moderate increase in ostomy output.   -  SEVERE (SCALE 8-10; OR WORST POSSIBLE): Increase of 7 or more stools daily over normal; moderate increase in ostomy output; incontinence.     1 2. ONSET: When did the diarrhea begin?      This morning  3. BM CONSISTENCY: How loose or watery is the diarrhea?      Watery  4. VOMITING: Are you also vomiting? If Yes, ask: How many times in the past 24 hours?      1 5. ABDOMEN PAIN: Are you having any abdomen pain? If Yes, ask: What does it feel like? (e.g., crampy, dull, intermittent, constant)      Not sure 6. ABDOMEN PAIN SEVERITY: If present, ask: How bad is the pain?  (e.g., Scale 1-10; mild, moderate, or severe)   - MILD (1-3): doesn't interfere with normal activities, abdomen  soft and not tender to touch    - MODERATE (4-7): interferes with normal activities or awakens from sleep, abdomen tender to touch    - SEVERE (8-10): excruciating pain, doubled over, unable to do any normal activities       Not sure  7. ORAL INTAKE: If vomiting, Have you been able to drink liquids? How much liquids have you had in the past 24 hours?     Coffee, water 8. HYDRATION: Any signs of dehydration? (e.g., dry mouth [not just dry lips], too weak to stand, dizziness, new weight loss) When did you last urinate?     Denies   11. OTHER SYMPTOMS: Do you have any other symptoms? (e.g., fever, blood in stool)       nausea    Wife Carmela called with concerns . Pt started Synjardy  on Monday- Pt switched to this. Pt started with n/v/diarrhea last night and this morning. Pt has taken Imodium. Pt is at work. Carmela is asking if medication should be stopped. Please advise  Protocols used: [facility]

## 2023-09-07 ENCOUNTER — Other Ambulatory Visit: Payer: Self-pay | Admitting: Internal Medicine

## 2023-09-07 DIAGNOSIS — E119 Type 2 diabetes mellitus without complications: Secondary | ICD-10-CM

## 2023-09-07 MED ORDER — INSULIN PEN NEEDLE 32G X 6 MM MISC: 1.0000 | Freq: Every day | 0 refills | Status: DC

## 2023-09-07 MED ORDER — TOUJEO MAX SOLOSTAR 300 UNIT/ML ~~LOC~~ SOPN: 20.0000 [IU] | PEN_INJECTOR | Freq: Every day | SUBCUTANEOUS | 0 refills | Status: DC

## 2023-09-07 NOTE — Telephone Encounter (Signed)
 I have prescribed a once daily insulin  injection

## 2023-09-07 NOTE — Telephone Encounter (Signed)
 Called pt and advised him daily insulin  injection medication was sent in for him instead and to start that. Again informed pt to please not take anymore non-prescribed left over medication and to please let us  know if insulin  is not covered so we can look into something else. Pt verbalized understanding

## 2023-09-07 NOTE — Telephone Encounter (Signed)
 Called pt and informed him pcp advised for him to stop the synjardy . Pt states he already did that but his blood sugars were in the 160s and was concerned so he said he had some leftover Metformin  and jardiance  and has been taking both of those instead. Advised pt I will send message to pcp to see what he wants pt to do as he should not self medicate, pt states he will wait to hear back to see what he should do and is even ok if Dr. Joshua wants to do insulin , as long as it is covered.

## 2023-09-07 NOTE — Telephone Encounter (Signed)
 Yes, hold the metformin 

## 2023-09-09 LAB — HM DIABETES EYE EXAM

## 2023-09-21 DIAGNOSIS — H353231 Exudative age-related macular degeneration, bilateral, with active choroidal neovascularization: Secondary | ICD-10-CM | POA: Diagnosis not present

## 2023-10-05 ENCOUNTER — Ambulatory Visit: Admitting: Gastroenterology

## 2023-10-05 ENCOUNTER — Encounter: Payer: Self-pay | Admitting: Gastroenterology

## 2023-10-05 VITALS — BP 130/80 | HR 64 | Ht 70.0 in | Wt 185.0 lb

## 2023-10-05 DIAGNOSIS — R12 Heartburn: Secondary | ICD-10-CM | POA: Diagnosis not present

## 2023-10-05 DIAGNOSIS — Z860100 Personal history of colon polyps, unspecified: Secondary | ICD-10-CM | POA: Diagnosis not present

## 2023-10-05 DIAGNOSIS — K5180 Other ulcerative colitis without complications: Secondary | ICD-10-CM

## 2023-10-05 DIAGNOSIS — K648 Other hemorrhoids: Secondary | ICD-10-CM | POA: Diagnosis not present

## 2023-10-05 DIAGNOSIS — K5190 Ulcerative colitis, unspecified, without complications: Secondary | ICD-10-CM | POA: Diagnosis not present

## 2023-10-05 MED ORDER — SULFASALAZINE 500 MG PO TABS
1000.0000 mg | ORAL_TABLET | Freq: Three times a day (TID) | ORAL | 3 refills | Status: DC
Start: 2023-10-05 — End: 2023-10-12

## 2023-10-05 MED ORDER — SUTAB 1479-225-188 MG PO TABS: ORAL_TABLET | ORAL | 0 refills | Status: DC

## 2023-10-05 MED ORDER — HYDROCORTISONE (PERIANAL) 2.5 % EX CREA: 1.0000 | TOPICAL_CREAM | Freq: Every day | CUTANEOUS | 0 refills | Status: AC | PRN

## 2023-10-05 MED ORDER — ONDANSETRON HCL 4 MG PO TABS: 4.0000 mg | ORAL_TABLET | Freq: Four times a day (QID) | ORAL | 0 refills | Status: DC | PRN

## 2023-10-05 NOTE — Patient Instructions (Addendum)
 _______________________________________________________  If your blood pressure at your visit was 140/90 or greater, please contact your primary care physician to follow up on this. _______________________________________________________  If you are age 70 or older, your body mass index should be between 23-30. Your Body mass index is 26.54 kg/m. If this is out of the aforementioned range listed, please consider follow up with your Primary Care Provider. ________________________________________________________  The  GI providers would like to encourage you to use MYCHART to communicate with providers for non-urgent requests or questions.  Due to long hold times on the telephone, sending your provider a message by [facility] may be a faster and more efficient way to get a response.  Please allow 48 business hours for a response.  Please remember that this is for non-urgent requests.  _______________________________________________________  [facility] is using a team-based approach to care.  Your team is made up of your doctor and two to three APPS. Our APPS (Nurse Practitioners and Physician Assistants) work with your physician to ensure care continuity for you. They are fully qualified to address your health concerns and develop a treatment plan. They communicate directly with your gastroenterologist to care for you. Seeing the Advanced Practice Practitioners on your physician's team can help you by facilitating care more promptly, often allowing for earlier appointments, access to diagnostic testing, procedures, and other specialty referrals.  We have sent the following medications to your pharmacy for you to pick up at your convenience:  Sulfazalazine 1000mg  three times daily Hydrocortisone  2.5% apply to rectum as needed Zofran  4mg  one tablet every 6 hours as needed    You have been scheduled for an endoscopy and colonoscopy. Please follow the written instructions given to you  at your visit today.  If you use inhalers (even only as needed), please bring them with you on the day of your procedure.  DO NOT TAKE 7 DAYS PRIOR TO TEST- Trulicity (dulaglutide) Ozempic, Wegovy (semaglutide) Mounjaro (tirzepatide) Bydureon Bcise (exanatide extended release)  DO NOT TAKE 1 DAY PRIOR TO YOUR TEST Rybelsus (semaglutide) Adlyxin (lixisenatide) Victoza (liraglutide) Byetta (exanatide) ___________________________________________________________________________  Due to recent changes in healthcare laws, you may see the results of your imaging and laboratory studies on MyChart before your provider has had a chance to review them.  We understand that in some cases there may be results that are confusing or concerning to you. Not all laboratory results come back in the same time frame and the provider may be waiting for multiple results in order to interpret others.  Please give us  48 hours in order for your provider to thoroughly review all the results before contacting the office for clarification of your results.    It was a pleasure to see you today!  Bruce Sanchez, D.O.

## 2023-10-05 NOTE — Progress Notes (Signed)
 Chief Complaint:    Symptomatic hemorrhoids, Ulcerative Colitis  GI History: 68 year old male with a history of HTN, HLD, diabetes, mild to moderate AS, OSA, ulcerative colitis.  History of left-sided colitis diagnosed around age 47 maintained on mesalamine  monotherapy.  Previously treated with sulfasalazine , acyclovir.  Last note from [facility] in [place] was 04/29/2009.  On that note, reportedly had a colonoscopy with left-sided colitis and dysplasia in 2009.  Endoscopy report is not available for review.  Has been taking sulfasalazine  1000 mg TID for years. No prior biologic, immunomodulator. Was treated with Prednisone at dx, and none in the last 30 years that he can recall.   Longstanding history of hemorrhoids that are intermittently symptomatic.  Responsive to hemorrhoid cream.     Separately, Hx of GERD with index symptoms of heartburn.  Tends to be after eating late and certain foods.  Will use occasional OTC antacids or OTC omeprazole.  No dysphagia.  Reports having EGD in [place] years ago that was normal.    Endoscopic History: - Colonoscopy (09/09/2020): Colon polyps, hemorrhoids.  Recommended repeat in 3 years for polyp surveillance    HPI:     Patient is a 68 y.o. male presenting to the Gastroenterology Clinic for evaluation of symptomatic hemorrhoids, and he would like to discuss repeat colonoscopy.  Was initially seen in the GI clinic by me on 11/24/2021 to establish care.  UC well-controlled on sulfasalazine .  He is due for repeat colonoscopy for ongoing surveillance of both his UC and history of colon polyps.  History of symptomatic hemorrhoids.  Had a recent flare about 2 months ago with BRB on tissue paper but also a/w loose stools and urgency. PCP gave Rx for Proctofoam and no improvement, but sxs have now resolved. Hemorrhoids had been previously responsive to hydrocortisone  2.5% cream on demand.  Reflux otherwise well-controlled with dietary  modifications and Prevacid on demand.   Reviewed labs from 04/2023 and 07/2023.  Normal CBC, BMP (BG 242), TSH, liver enzymes.  A1c 8.5% --> 8.1% along with microalbuminuria and was started on metformin  and Farxiga .  No recent abdominal imaging for review.  Review of systems:     No chest pain, no SOB, no fevers, no urinary sx   Past Medical History:  Diagnosis Date   Colitis    Diabetes mellitus without complication (HCC)    Hyperlipidemia    Hypertension    Ulcerative colitis (HCC)     Patient's surgical history, family medical history, social history, medications and allergies were all reviewed in Epic    Current Outpatient Medications  Medication Sig Dispense Refill   amLODipine  (NORVASC ) 10 MG tablet Take 1 tablet (10 mg total) by mouth daily. 90 tablet 3   Blood Glucose Monitoring Suppl DEVI 1 each by Does not apply route in the morning, at noon, and at bedtime. May substitute to any manufacturer covered by patient's insurance. 1 each 0   dicyclomine (BENTYL) 10 MG capsule Take by mouth.     Glucose Blood (BLOOD GLUCOSE TEST STRIPS) STRP Use to check blood glucose up to 3 times daily. May substitute to any manufacturer covered by patient's insurance. 100 strip 11   insulin  glargine, 2 Unit Dial, (TOUJEO  MAX SOLOSTAR) 300 UNIT/ML Solostar Pen Inject 20 Units into the skin daily. 6 mL 0   Insulin  Pen Needle 32G X 6 MM MISC 1 Act by Does not apply route daily. 100 each 0   Lancets Misc. MISC Use to check blood glucose  up to 3 times daily. May substitute to any manufacturer covered by patient's insurance. 100 each 0   metoprolol  succinate (TOPROL -XL) 50 MG 24 hr tablet TAKE ONE TABLET BY MOUTH DAILY WITH OR IMMEDIATELY FOLLOWING A MEAL 90 tablet 0   pramoxine (PROCTOFOAM) 1 % foam Place 1 Application rectally 3 (three) times daily as needed for anal itching. 15 g 2   rosuvastatin  (CRESTOR ) 20 MG tablet Take 1 tablet (20 mg total) by mouth daily. 90 tablet 1   sulfaSALAzine  (AZULFIDINE )  500 MG tablet Take 2 tablets (1,000 mg total) by mouth 4 (four) times daily. 720 tablet 0   hydrocortisone  (ANUSOL -HC) 2.5 % rectal cream Place 1 Application rectally 2 (two) times daily as needed for hemorrhoids or anal itching. 30 g 3   No current facility-administered medications for this visit.    Physical Exam:     BP 130/80   Pulse 64   Ht 5' 10 (1.778 m)   Wt 185 lb (83.9 kg)   BMI 26.54 kg/m   GENERAL:  Pleasant male in NAD PSYCH: : Cooperative, normal affect NEURO: Alert and oriented x 3, no focal neurologic deficits   IMPRESSION and PLAN:    1) Ulcerative Colitis 68 year old male with longstanding history of ulcerative colitis.  Has had steroids many years ago. Previously on mesalamine , but that was no longer covered by insurance and subsequently switched to sulfasalazine  monotherapy, which has maintained UC in remission for years.  He is interested in potentially going back to mesalamine  (pending insurance coverage) as he thought that may have worked better than the sulfasalazine  overall.  - Colonoscopy for continued surveillance - Plan for Sutab  for bowel prep per patient preference - Provided with Rx for Zofran  4 mg to use as needed for bowel prep given previous nausea/vomiting with prep - Continue sulfasalazine .  Refill placed today - Discussed potentially changing to mesalamine  depending on colonoscopy results as he feels this was clinically more efficacious.  If active inflammation on colonoscopy, plan for med change  2) Internal hemorrhoids Intermittently symptomatic hemorrhoids.  However, not 100% certain that his recent symptoms 2 months ago were from hemorrhoids vs small UC flare. - Provided with Rx for hydrocortisone  2.5% cream to use on demand for hemorrhoidal symptoms  3) History of colon polyps Last colonoscopy was in 2022 and notable for colon polyps. Size, location, number, histology, etc. unknown, but patient was told to repeat in 3 years - Repeat  colonoscopy for ongoing surveillance  4) Heartburn Reflux symptoms well-controlled with dietary modifications and Prevacid on demand - Continue antireflux lifestyle/dietary modifications  The indications, risks, and benefits of colonoscopy were explained to the patient in detail. Risks include but are not limited to bleeding, perforation, adverse reaction to medications, and cardiopulmonary compromise. Sequelae include but are not limited to the possibility of surgery, hospitalization, and mortality. The patient verbalized understanding and wished to proceed. All questions answered, referred to the scheduler and bowel prep ordered. Further recommendations pending results of the exam.    Sandor LULLA Flatter ,DO, FACG 10/05/2023, 8:57 AM

## 2023-10-07 ENCOUNTER — Other Ambulatory Visit: Payer: Self-pay | Admitting: Internal Medicine

## 2023-10-07 DIAGNOSIS — I10 Essential (primary) hypertension: Secondary | ICD-10-CM

## 2023-10-12 ENCOUNTER — Ambulatory Visit (INDEPENDENT_AMBULATORY_CARE_PROVIDER_SITE_OTHER): Payer: Medicare Other | Admitting: Internal Medicine

## 2023-10-12 ENCOUNTER — Ambulatory Visit: Payer: Medicare Other | Admitting: Internal Medicine

## 2023-10-12 ENCOUNTER — Encounter: Payer: Self-pay | Admitting: Internal Medicine

## 2023-10-12 VITALS — BP 142/78 | HR 68 | Temp 98.3°F | Resp 16 | Ht 70.0 in | Wt 186.6 lb

## 2023-10-12 DIAGNOSIS — E118 Type 2 diabetes mellitus with unspecified complications: Secondary | ICD-10-CM | POA: Diagnosis not present

## 2023-10-12 DIAGNOSIS — I10 Essential (primary) hypertension: Secondary | ICD-10-CM | POA: Diagnosis not present

## 2023-10-12 DIAGNOSIS — K5180 Other ulcerative colitis without complications: Secondary | ICD-10-CM

## 2023-10-12 DIAGNOSIS — Z0000 Encounter for general adult medical examination without abnormal findings: Secondary | ICD-10-CM | POA: Diagnosis not present

## 2023-10-12 DIAGNOSIS — Z0001 Encounter for general adult medical examination with abnormal findings: Secondary | ICD-10-CM

## 2023-10-12 DIAGNOSIS — Z125 Encounter for screening for malignant neoplasm of prostate: Secondary | ICD-10-CM

## 2023-10-12 MED ORDER — SULFASALAZINE 500 MG PO TABS: 1000.0000 mg | ORAL_TABLET | Freq: Three times a day (TID) | ORAL | 3 refills | Status: AC

## 2023-10-12 NOTE — Patient Instructions (Signed)
 Health Maintenance, Male  Adopting a healthy lifestyle and getting preventive care are important in promoting health and wellness. Ask your health care provider about:  The right schedule for you to have regular tests and exams.  Things you can do on your own to prevent diseases and keep yourself healthy.  What should I know about diet, weight, and exercise?  Eat a healthy diet    Eat a diet that includes plenty of vegetables, fruits, low-fat dairy products, and lean protein.  Do not eat a lot of foods that are high in solid fats, added sugars, or sodium.  Maintain a healthy weight  Body mass index (BMI) is a measurement that can be used to identify possible weight problems. It estimates body fat based on height and weight. Your health care provider can help determine your BMI and help you achieve or maintain a healthy weight.  Get regular exercise  Get regular exercise. This is one of the most important things you can do for your health. Most adults should:  Exercise for at least 150 minutes each week. The exercise should increase your heart rate and make you sweat (moderate-intensity exercise).  Do strengthening exercises at least twice a week. This is in addition to the moderate-intensity exercise.  Spend less time sitting. Even light physical activity can be beneficial.  Watch cholesterol and blood lipids  Have your blood tested for lipids and cholesterol at 70 years of age, then have this test every 5 years.  You may need to have your cholesterol levels checked more often if:  Your lipid or cholesterol levels are high.  You are older than 70 years of age.  You are at high risk for heart disease.  What should I know about cancer screening?  Many types of cancers can be detected early and may often be prevented. Depending on your health history and family history, you may need to have cancer screening at various ages. This may include screening for:  Colorectal cancer.  Prostate cancer.  Skin cancer.  Lung  cancer.  What should I know about heart disease, diabetes, and high blood pressure?  Blood pressure and heart disease  High blood pressure causes heart disease and increases the risk of stroke. This is more likely to develop in people who have high blood pressure readings or are overweight.  Talk with your health care provider about your target blood pressure readings.  Have your blood pressure checked:  Every 3-5 years if you are 24-52 years of age.  Every year if you are 3 years old or older.  If you are between the ages of 60 and 72 and are a current or former smoker, ask your health care provider if you should have a one-time screening for abdominal aortic aneurysm (AAA).  Diabetes  Have regular diabetes screenings. This checks your fasting blood sugar level. Have the screening done:  Once every three years after age 66 if you are at a normal weight and have a low risk for diabetes.  More often and at a younger age if you are overweight or have a high risk for diabetes.  What should I know about preventing infection?  Hepatitis B  If you have a higher risk for hepatitis B, you should be screened for this virus. Talk with your health care provider to find out if you are at risk for hepatitis B infection.  Hepatitis C  Blood testing is recommended for:  Everyone born from [birth year] through 1967.  Anyone  with known risk factors for hepatitis C.  Sexually transmitted infections (STIs)  You should be screened each year for STIs, including gonorrhea and chlamydia, if:  You are sexually active and are younger than 70 years of age.  You are older than 70 years of age and your health care provider tells you that you are at risk for this type of infection.  Your sexual activity has changed since you were last screened, and you are at increased risk for chlamydia or gonorrhea. Ask your health care provider if you are at risk.  Ask your health care provider about whether you are at high risk for HIV. Your health care provider  may recommend a prescription medicine to help prevent HIV infection. If you choose to take medicine to prevent HIV, you should first get tested for HIV. You should then be tested every 3 months for as long as you are taking the medicine.  Follow these instructions at home:  Alcohol use  Do not drink alcohol if your health care provider tells you not to drink.  If you drink alcohol:  Limit how much you have to 0-2 drinks a day.  Know how much alcohol is in your drink. In the [place] one drink equals one 12 oz bottle of beer (355 mL), one 5 oz glass of wine (148 mL), or one 1 oz glass of hard liquor (44 mL).  Lifestyle  Do not use any products that contain nicotine or tobacco. These products include cigarettes, chewing tobacco, and vaping devices, such as e-cigarettes. If you need help quitting, ask your health care provider.  Do not use street drugs.  Do not share needles.  Ask your health care provider for help if you need support or information about quitting drugs.  General instructions  Schedule regular health, dental, and eye exams.  Stay current with your vaccines.  Tell your health care provider if:  You often feel depressed.  You have ever been abused or do not feel safe at home.  Summary  Adopting a healthy lifestyle and getting preventive care are important in promoting health and wellness.  Follow your health care provider's instructions about healthy diet, exercising, and getting tested or screened for diseases.  Follow your health care provider's instructions on monitoring your cholesterol and blood pressure.  This information is not intended to replace advice given to you by your health care provider. Make sure you discuss any questions you have with your health care provider.  Document Revised: 07/07/2020 Document Reviewed: 07/07/2020  Elsevier Patient Education  2024 [employer].

## 2023-10-12 NOTE — Progress Notes (Signed)
 Subjective:  Patient ID: Bruce Sanchez, male    DOB: 05/19/1955  Age: 68 y.o. MRN: [id number]  CC: Hypertension, Diabetes, Hyperlipidemia, and Annual Exam   HPI Bruce Sanchez presents for a CPX and f/up ----  Discussed the use of AI scribe software for clinical note transcription with the patient, who gave verbal consent to proceed.  History of Present Illness [name] is a 68 year old male who presents for a follow-up visit.  He is preparing for an upcoming colonoscopy and has experienced symptoms he initially attributed to internal hemorrhoids but now suspects may be related to colitis due to diarrhea. He experiences occasional mild discomfort and reports very little bleeding.  His blood pressure was recently measured at 154/74, which he believes was elevated due to consuming two cups of coffee prior to the reading. A subsequent reading was 142/78. He does not report any symptoms related to high blood pressure.  Regarding diabetes management, he has been on insulin  for the past two weeks, which has helped lower his blood sugar levels to an average of 125-130 mg/dL. A reading of 114 mg/dL was noted after having cereal for dinner the previous night. No symptoms of hypoglycemia, such as blood sugars below 50 mg/dL, excessive thirst, or excessive urination. He mentions difficulty with insulin  injections due to poor vision, occasionally bending the needle.  His last eye exam was last month.  He has been taking sulfasalazine  since he was 28 or 68 years old without side effects. He mentions a preference for mesalamine  but finds it too expensive.    Outpatient Medications Prior to Visit  Medication Sig Dispense Refill   amLODipine  (NORVASC ) 10 MG tablet Take 1 tablet (10 mg total) by mouth daily. 90 tablet 3   Blood Glucose Monitoring Suppl DEVI 1 each by Does not apply route in the morning, at noon, and at bedtime. May substitute to any manufacturer covered by patient's insurance. 1  each 0   dicyclomine (BENTYL) 10 MG capsule Take by mouth.     Glucose Blood (BLOOD GLUCOSE TEST STRIPS) STRP Use to check blood glucose up to 3 times daily. May substitute to any manufacturer covered by patient's insurance. 100 strip 11   hydrocortisone  (ANUSOL -HC) 2.5 % rectal cream Place 1 Application rectally daily as needed for hemorrhoids or anal itching. 30 g 0   insulin  glargine, 2 Unit Dial, (TOUJEO  MAX SOLOSTAR) 300 UNIT/ML Solostar Pen Inject 20 Units into the skin daily. 6 mL 0   Insulin  Pen Needle 32G X 6 MM MISC 1 Act by Does not apply route daily. 100 each 0   Lancets Misc. MISC Use to check blood glucose up to 3 times daily. May substitute to any manufacturer covered by patient's insurance. 100 each 0   metoprolol  succinate (TOPROL -XL) 50 MG 24 hr tablet TAKE 1 TABLET BY MOUTH DAILY WITH OR IMMEDIATELY AFTER A MEAL 90 tablet 0   ondansetron  (ZOFRAN ) 4 MG tablet Take 1 tablet (4 mg total) by mouth every 6 (six) hours as needed for nausea or vomiting. 30 tablet 0   pramoxine (PROCTOFOAM) 1 % foam Place 1 Application rectally 3 (three) times daily as needed for anal itching. 15 g 2   rosuvastatin  (CRESTOR ) 20 MG tablet Take 1 tablet (20 mg total) by mouth daily. 90 tablet 1   Sodium Sulfate-Mag Sulfate-KCl (SUTAB ) 1479-225-188 MG TABS Use as directed for colonoscopy. MANUFACTURER CODES!! BIN: [id number] PCN: CN GROUP: [id number] MEMBER ID: [id number];MLW AS SECONDARY INSURANCE ;NO PRIOR AUTHORIZATION 24  tablet 0   sulfaSALAzine  (AZULFIDINE ) 500 MG tablet Take 2 tablets (1,000 mg total) by mouth 3 (three) times daily. 180 tablet 3   No facility-administered medications prior to visit.    ROS Review of Systems  Constitutional:  Negative for appetite change, chills, diaphoresis, fatigue and fever.  HENT: Negative.    Eyes: Negative.   Respiratory: Negative.  Negative for cough, chest tightness, shortness of breath and wheezing.   Cardiovascular:  Negative for chest pain, palpitations and  leg swelling.  Gastrointestinal: Negative.  Negative for abdominal pain, blood in stool, constipation, diarrhea, nausea and vomiting.  Endocrine: Negative.   Genitourinary: Negative.  Negative for difficulty urinating, dysuria, hematuria, scrotal swelling and testicular pain.  Musculoskeletal: Negative.  Negative for arthralgias, joint swelling and myalgias.  Skin: Negative.   Neurological:  Negative for dizziness, weakness, light-headedness and numbness.  Hematological:  Negative for adenopathy. Does not bruise/bleed easily.  Psychiatric/Behavioral: Negative.      Objective:  BP (!) 142/78 (BP Location: Left Arm, Patient Position: Sitting, Cuff Size: Normal)   Pulse 68   Temp 98.3 F (36.8 C) (Oral)   Resp 16   Ht 5' 10 (1.778 m)   Wt 186 lb 9.6 oz (84.6 kg)   SpO2 96%   BMI 26.77 kg/m   BP Readings from Last 3 Encounters:  10/12/23 (!) 142/78  10/05/23 130/80  08/03/23 (!) 142/74    Wt Readings from Last 3 Encounters:  10/12/23 186 lb 9.6 oz (84.6 kg)  10/05/23 185 lb (83.9 kg)  08/03/23 192 lb (87.1 kg)    Physical Exam Vitals reviewed.  Constitutional:      Appearance: Normal appearance.  HENT:     Nose: Nose normal.     Mouth/Throat:     Mouth: Mucous membranes are moist.  Eyes:     General: No scleral icterus.    Conjunctiva/sclera: Conjunctivae normal.  Neck:     Thyroid : Thyromegaly present. No thyroid  mass or thyroid  tenderness.  Cardiovascular:     Rate and Rhythm: Normal rate and regular rhythm.     Heart sounds: Murmur heard.     Systolic murmur is present with a grade of 3/6.     No friction rub. No gallop.  Pulmonary:     Effort: Pulmonary effort is normal.     Breath sounds: No stridor. No wheezing, rhonchi or rales.  Abdominal:     General: Abdomen is flat.     Palpations: There is no mass.     Tenderness: There is no abdominal tenderness. There is no guarding.     Hernia: No hernia is present.  Musculoskeletal:     Cervical back: Neck  supple.     Right lower leg: No edema.     Left lower leg: No edema.  Lymphadenopathy:     Cervical: No cervical adenopathy.  Skin:    General: Skin is warm and dry.  Neurological:     General: No focal deficit present.     Mental Status: He is alert. Mental status is at baseline.  Psychiatric:        Mood and Affect: Mood normal.        Behavior: Behavior normal.     Lab Results  Component Value Date   WBC 5.9 04/13/2023   HGB 15.8 04/13/2023   HCT 47.9 04/13/2023   PLT 184.0 04/13/2023   GLUCOSE 242 (H) 08/03/2023   CHOL 115 08/03/2023   TRIG 170.0 (H) 08/03/2023   HDL 39.80  08/03/2023   LDLDIRECT 126.0 08/18/2022   LDLCALC 42 08/03/2023   ALT 23 08/03/2023   AST 21 08/03/2023   NA 137 08/03/2023   K 3.7 08/03/2023   CL 99 08/03/2023   CREATININE 0.64 08/03/2023   BUN 13 08/03/2023   CO2 32 08/03/2023   TSH 1.15 08/03/2023   PSA 1.13 08/18/2022   HGBA1C 8.1 (H) 08/03/2023   MICROALBUR 62.2 (H) 08/03/2023    CT Chest Wo Contrast Result Date: 03/01/2023 CLINICAL DATA:  Follow-up of sternal lesion. EXAM: CT CHEST WITHOUT CONTRAST TECHNIQUE: Multidetector CT imaging of the chest was performed following the standard protocol without IV contrast. RADIATION DOSE REDUCTION: This exam was performed according to the departmental dose-optimization program which includes automated exposure control, adjustment of the mA and/or kV according to patient size and/or use of iterative reconstruction technique. COMPARISON:  08/31/2022 FINDINGS: Cardiovascular: Aortic atherosclerosis. Borderline cardiomegaly, without pericardial effusion. Left main and 3 vessel coronary artery calcification. Aortic valve calcification. Pulmonary artery enlargement, outflow tract 3.1 cm. Mediastinum/Nodes: Thyroid  enlargement and heterogeneity. This has been evaluated on previous imaging. (ref: J Am Coll Radiol. 2015 Feb;12(2): 143-50). No mediastinal or hilar adenopathy, given limitations of unenhanced CT.  Lungs/Pleura: No pleural fluid. Right lower lobe scarring laterally with right hemidiaphragm elevation. Tiny subpleural lymph nodes again identified along the left major fissure. No suspicious pulmonary nodule or mass. Upper Abdomen: Normal imaged portions of the liver, spleen, stomach, pancreas, gallbladder, adrenal glands, kidneys. Musculoskeletal: Diffuse idiopathic skeletal hyperostosis throughout the thoracic spine. 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 The sternal body primarily lytic lesion measures 3.0 x 2.0 cm on 135/6 versus similar (when remeasured). Sharp transition, without aggressive features. Similar trabeculation within the inferior portion. IMPRESSION: 1. Similar size and morphology of a sternal body lesion over nearly 6 months. Given morphology and stability, favored to represent a hemangioma . In the absence of clinically indicated signs/symptoms require(s) no independent follow-up. 2.  No acute process in the chest. 3. Pulmonary artery enlargement suggests pulmonary arterial hypertension. 4. Aortic valvular calcifications. Consider echocardiography to evaluate for valvular dysfunction. 5. Coronary artery atherosclerosis. Aortic Atherosclerosis (ICD10-I70.0). Electronically Signed   By: Rockey Kilts M.D.   On: 03/01/2023 14:17    Assessment & Plan:  Other ulcerative colitis without complication (HCC) -     sulfaSALAzine ; Take 2 tablets (1,000 mg total) by mouth 3 (three) times daily.  Dispense: 180 tablet; Refill: 3  Encounter for general adult medical examination with abnormal findings- Exam completed, no labs needed, vaccines reviewed, cancer screenings are UTD, pt ed material was given.   Type II diabetes mellitus with manifestations (HCC) - Will recheck his A1C in 3 months.  Primary hypertension- BP is adequately well controlled.     Follow-up: Return in about 3 months (around 01/12/2024).  Debby Molt, MD

## 2023-11-09 ENCOUNTER — Encounter: Payer: Self-pay | Admitting: Gastroenterology

## 2023-11-09 ENCOUNTER — Ambulatory Visit (AMBULATORY_SURGERY_CENTER): Admitting: Gastroenterology

## 2023-11-09 VITALS — BP 146/73 | HR 76 | Temp 98.2°F | Resp 16 | Ht 70.0 in | Wt 185.0 lb

## 2023-11-09 DIAGNOSIS — K6389 Other specified diseases of intestine: Secondary | ICD-10-CM | POA: Diagnosis not present

## 2023-11-09 DIAGNOSIS — K5730 Diverticulosis of large intestine without perforation or abscess without bleeding: Secondary | ICD-10-CM | POA: Diagnosis not present

## 2023-11-09 DIAGNOSIS — Z860100 Personal history of colon polyps, unspecified: Secondary | ICD-10-CM

## 2023-11-09 DIAGNOSIS — K5180 Other ulcerative colitis without complications: Secondary | ICD-10-CM | POA: Diagnosis not present

## 2023-11-09 DIAGNOSIS — Z1211 Encounter for screening for malignant neoplasm of colon: Secondary | ICD-10-CM | POA: Diagnosis not present

## 2023-11-09 DIAGNOSIS — K5190 Ulcerative colitis, unspecified, without complications: Secondary | ICD-10-CM | POA: Diagnosis not present

## 2023-11-09 MED ORDER — SODIUM CHLORIDE 0.9 % IV SOLN: 500.0000 mL | INTRAVENOUS | Status: DC

## 2023-11-09 MED ORDER — MESALAMINE 1000 MG RE SUPP: 1000.0000 mg | Freq: Every day | RECTAL | 12 refills | Status: DC

## 2023-11-09 NOTE — Op Note (Signed)
  [facility] Patient Name: Bruce Sanchez Procedure Date: 11/09/2023 9:42 AM MRN: [id number] Endoscopist: Sandor Flatter , MD, [id number] Age: 68 Referring MD:  Date of Birth: October 03, 1955 Gender: Male Account #: [id number] Procedure:                Colonoscopy Indications:              High risk colon cancer surveillance: Personal                            history of colonic polyps, High risk colon cancer                            surveillance: Ulcerative left sided colitis of 8                            (or more) years duration                           UC currently in remission with sulfasalazine                             monotherapy. Medicines:                Monitored Anesthesia Care Procedure:                Pre-Anesthesia Assessment:                           - Prior to the procedure, a History and Physical                            was performed, and patient medications and                            allergies were reviewed. The patient's tolerance of                            previous anesthesia was also reviewed. The risks                            and benefits of the procedure and the sedation                            options and risks were discussed with the patient.                            All questions were answered, and informed consent                            was obtained. Prior Anticoagulants: The patient has                            taken no anticoagulant or antiplatelet agents. ASA  Grade Assessment: II - A patient with mild systemic                            disease. After reviewing the risks and benefits,                            the patient was deemed in satisfactory condition to                            undergo the procedure.                           After obtaining informed consent, the colonoscope                            was passed under direct vision. Throughout the                             procedure, the patient's blood pressure, pulse, and                            oxygen saturations were monitored continuously. The                            CF [id number] #[id number] was introduced through the anus                            and advanced to the the terminal ileum. The                            colonoscopy was performed without difficulty. The                            patient tolerated the procedure well. The quality                            of the bowel preparation was good. The terminal                            ileum, ileocecal valve, appendiceal orifice, and                            rectum were photographed. Scope In: 9:57:12 AM Scope Out: 10:14:32 AM Scope Withdrawal Time: 0 hours 14 minutes 13 seconds  Total Procedure Duration: 0 hours 17 minutes 20 seconds  Findings:                 The perianal and digital rectal examinations were                            normal.                           Subtle inflammatory changes characterized by loss  of vascularity was found throughout the colon. This                            was most pronounced in the rectum through mid                            sigmoid colon, with decreased vascularity and                            chronic scarring. No aphthous ulcers noted.                            Biopsies were taken throughout the colon with a                            cold forceps for histology. Estimated blood loss                            was minimal.                           A few small-mouthed diverticula were found in the                            sigmoid colon, descending colon, transverse colon                            and ascending colon.                           Retroflexion in the rectum was not performed due to                            anatomy (narrow rectla vault with reduced                            distensibility).                           The terminal ileum appeared  normal. Complications:            No immediate complications. Estimated Blood Loss:     Estimated blood loss was minimal. Impression:               - Mild inflammation was found in the entire                            examined colon. Biopsied.                           - Diverticulosis in the sigmoid colon, in the                            descending colon, in the transverse colon and in  the ascending colon.                           - The examined portion of the ileum was normal. Recommendation:           - Patient has a contact number available for                            emergencies. The signs and symptoms of potential                            delayed complications were discussed with the                            patient. Return to normal activities tomorrow.                            Written discharge instructions were provided to the                            patient.                           - Resume previous diet.                           - Continue present medications.                           - Await pathology results.                           - Repeat colonoscopy for surveillance based on                            pathology results. Sandor Flatter, MD 11/09/2023 10:24:00 AM

## 2023-11-09 NOTE — Patient Instructions (Addendum)
 Continue present medications. Await pathology results. Repeat colonoscopy for surveillance based on pathology results.                           YOU HAD AN ENDOSCOPIC PROCEDURE TODAY AT THE [facility]:   Refer to the procedure report that was given to you for any specific questions about what was found during the examination.  If the procedure report does not answer your questions, please call your gastroenterologist to clarify.  If you requested that your care partner not be given the details of your procedure findings, then the procedure report has been included in a sealed envelope for you to review at your convenience later.  YOU SHOULD EXPECT: Some feelings of bloating in the abdomen. Passage of more gas than usual.  Walking can help get rid of the air that was put into your GI tract during the procedure and reduce the bloating. If you had a lower endoscopy (such as a colonoscopy or flexible sigmoidoscopy) you may notice spotting of blood in your stool or on the toilet paper. If you underwent a bowel prep for your procedure, you may not have a normal bowel movement for a few days.  Please Note:  You might notice some irritation and congestion in your nose or some drainage.  This is from the oxygen used during your procedure.  There is no need for concern and it should clear up in a day or so.  SYMPTOMS TO REPORT IMMEDIATELY:  Following lower endoscopy (colonoscopy or flexible sigmoidoscopy):  Excessive amounts of blood in the stool  Significant tenderness or worsening of abdominal pains  Swelling of the abdomen that is new, acute  Fever of 100F or higher  For urgent or emergent issues, a gastroenterologist can be reached at any hour by calling [phone]. Do not use MyChart messaging for urgent concerns.    DIET:  We do recommend a small meal at first, but then you may proceed to your regular diet.  Drink plenty of fluids but you should avoid alcoholic beverages for 24  hours.  ACTIVITY:  You should plan to take it easy for the rest of today and you should NOT DRIVE or use heavy machinery until tomorrow (because of the sedation medicines used during the test).    FOLLOW UP: Our staff will call the number listed on your records the next business day following your procedure.  We will call around 7:15- 8:00 am to check on you and address any questions or concerns that you may have regarding the information given to you following your procedure. If we do not reach you, we will leave a message.     If any biopsies were taken you will be contacted by phone or by letter within the next 1-3 weeks.  Please call us  at [phone] if you have not heard about the biopsies in 3 weeks.    SIGNATURES/CONFIDENTIALITY: You and/or your care partner have signed paperwork which will be entered into your electronic medical record.  These signatures attest to the fact that that the information above on your After Visit Summary has been reviewed and is understood.  Full responsibility of the confidentiality of this discharge information lies with you and/or your care-partner.

## 2023-11-09 NOTE — Progress Notes (Signed)
 Called to room to assist during endoscopic procedure.  Patient ID and intended procedure confirmed with present staff. Received instructions for my participation in the procedure from the performing physician.

## 2023-11-09 NOTE — Progress Notes (Signed)
 Canasa  suppository 1000 mg at bedtime, #30, 12 refills VO per Dr. San Spear to pharmacy

## 2023-11-09 NOTE — Progress Notes (Signed)
 I have reviewed the patient's medical history in detail and updated the computerized patient record.

## 2023-11-09 NOTE — Progress Notes (Signed)
 Transferred to PACU via stretcher, arousing, VSS.

## 2023-11-09 NOTE — Progress Notes (Signed)
 GASTROENTEROLOGY PROCEDURE H&P NOTE   Primary Care Physician: Joshua Debby CROME, MD    Reason for Procedure:   Ulcerative Colitis surveillance, history of colon polyps  Plan:    Colonoscopy  Patient is appropriate for endoscopic procedure(s) in the ambulatory ([facility]) setting.  The nature of the procedure, as well as the risks, benefits, and alternatives were carefully and thoroughly reviewed with the patient. Ample time for discussion and questions allowed. The patient understood, was satisfied, and agreed to proceed.     HPI: Bruce Sanchez is a 68 y.o. male who presents for colonsocopy for ongoing surveillance due to hx of UC and hx of colon polyps. UC currently in remission with sulfasalazine  monotherapy. Does have intermittently symptomatic hemorrhoids. Last colonoscopy was in 2022 and notable for colon polyps. Size, location, number, histology, etc. unknown, but patient was told to repeat in 3 years   Past Medical History:  Diagnosis Date   Arthritis    Cataract    Colitis    Diabetes mellitus without complication (HCC)    Heart murmur    Hyperlipidemia    Hypertension    Thyroid  disease    Ulcerative colitis (HCC)     Past Surgical History:  Procedure Laterality Date   HERNIA REPAIR     WISDOM TOOTH EXTRACTION      Prior to Admission medications   Medication Sig Start Date End Date Taking? Authorizing Provider  amLODipine  (NORVASC ) 10 MG tablet Take 1 tablet (10 mg total) by mouth daily. 11/10/22   Lavona Agent, MD  Blood Glucose Monitoring Suppl DEVI 1 each by Does not apply route in the morning, at noon, and at bedtime. May substitute to any manufacturer covered by patient's insurance. 07/11/23   Joshua Debby CROME, MD  dicyclomine (BENTYL) 10 MG capsule Take by mouth. 02/11/21 10/12/23  [provider]  Glucose Blood (BLOOD GLUCOSE TEST STRIPS) STRP Use to check blood glucose up to 3 times daily. May substitute to any manufacturer covered by patient's  insurance. 07/11/23   Joshua Debby CROME, MD  hydrocortisone  (ANUSOL -HC) 2.5 % rectal cream Place 1 Application rectally daily as needed for hemorrhoids or anal itching. 10/05/23   Annibelle Brazie V, DO  insulin  glargine, 2 Unit Dial, (TOUJEO  MAX SOLOSTAR) 300 UNIT/ML Solostar Pen Inject 20 Units into the skin daily. 09/07/23   Joshua Debby CROME, MD  Insulin  Pen Needle 32G X 6 MM MISC 1 Act by Does not apply route daily. 09/07/23   Joshua Debby CROME, MD  Lancets Misc. MISC Use to check blood glucose up to 3 times daily. May substitute to any manufacturer covered by patient's insurance. 07/11/23   Joshua Debby CROME, MD  metoprolol  succinate (TOPROL -XL) 50 MG 24 hr tablet TAKE 1 TABLET BY MOUTH DAILY WITH OR IMMEDIATELY AFTER A MEAL 10/10/23   Joshua Debby CROME, MD  ondansetron  (ZOFRAN ) 4 MG tablet Take 1 tablet (4 mg total) by mouth every 6 (six) hours as needed for nausea or vomiting. 10/05/23   Terez Montee V, DO  pramoxine (PROCTOFOAM) 1 % foam Place 1 Application rectally 3 (three) times daily as needed for anal itching. 08/03/23   Joshua Debby CROME, MD  rosuvastatin  (CRESTOR ) 20 MG tablet Take 1 tablet (20 mg total) by mouth daily. 08/08/23   Joshua Debby CROME, MD  Sodium Sulfate-Mag Sulfate-KCl (SUTAB ) 1479-225-188 MG TABS Use as directed for colonoscopy. MANUFACTURER CODES!! BIN: [id number] PCN: CN GROUP: [id number] MEMBER ID: [id number];MLW AS SECONDARY INSURANCE ;NO PRIOR AUTHORIZATION 10/05/23   Delpha Perko,  Kaelon Weekes V, DO  sulfaSALAzine  (AZULFIDINE ) 500 MG tablet Take 2 tablets (1,000 mg total) by mouth 3 (three) times daily. 10/12/23   Joshua Debby CROME, MD    Current Outpatient Medications  Medication Sig Dispense Refill   amLODipine  (NORVASC ) 10 MG tablet Take 1 tablet (10 mg total) by mouth daily. 90 tablet 3   Blood Glucose Monitoring Suppl DEVI 1 each by Does not apply route in the morning, at noon, and at bedtime. May substitute to any manufacturer covered by patient's insurance. 1 each 0   dicyclomine (BENTYL) 10 MG  capsule Take by mouth.     Glucose Blood (BLOOD GLUCOSE TEST STRIPS) STRP Use to check blood glucose up to 3 times daily. May substitute to any manufacturer covered by patient's insurance. 100 strip 11   hydrocortisone  (ANUSOL -HC) 2.5 % rectal cream Place 1 Application rectally daily as needed for hemorrhoids or anal itching. 30 g 0   insulin  glargine, 2 Unit Dial, (TOUJEO  MAX SOLOSTAR) 300 UNIT/ML Solostar Pen Inject 20 Units into the skin daily. 6 mL 0   Insulin  Pen Needle 32G X 6 MM MISC 1 Act by Does not apply route daily. 100 each 0   Lancets Misc. MISC Use to check blood glucose up to 3 times daily. May substitute to any manufacturer covered by patient's insurance. 100 each 0   metoprolol  succinate (TOPROL -XL) 50 MG 24 hr tablet TAKE 1 TABLET BY MOUTH DAILY WITH OR IMMEDIATELY AFTER A MEAL 90 tablet 0   ondansetron  (ZOFRAN ) 4 MG tablet Take 1 tablet (4 mg total) by mouth every 6 (six) hours as needed for nausea or vomiting. 30 tablet 0   pramoxine (PROCTOFOAM) 1 % foam Place 1 Application rectally 3 (three) times daily as needed for anal itching. 15 g 2   rosuvastatin  (CRESTOR ) 20 MG tablet Take 1 tablet (20 mg total) by mouth daily. 90 tablet 1   Sodium Sulfate-Mag Sulfate-KCl (SUTAB ) 1479-225-188 MG TABS Use as directed for colonoscopy. MANUFACTURER CODES!! BIN: [id number] PCN: CN GROUP: [id number] MEMBER ID: [id number];MLW AS SECONDARY INSURANCE ;NO PRIOR AUTHORIZATION 24 tablet 0   sulfaSALAzine  (AZULFIDINE ) 500 MG tablet Take 2 tablets (1,000 mg total) by mouth 3 (three) times daily. 180 tablet 3   Current Facility-Administered Medications  Medication Dose Route Frequency Provider Last Rate Last Admin   0.9 %  sodium chloride  infusion  500 mL Intravenous Continuous Telia Amundson V, DO        Allergies as of 11/09/2023 - Review Complete 11/09/2023  Allergen Reaction Noted   Metformin  and related Diarrhea 09/07/2023   Trulicity [dulaglutide] Nausea And Vomiting 05/08/2021   Synjardy   [empagliflozin -metformin  hcl] Diarrhea and Nausea Only 10/12/2023   Olmesartan  Rash 11/11/2021    Family History  Problem Relation Age of Onset   Diabetes Mother    Arthritis Mother    Heart attack Mother    Heart disease Father    Heart attack Father    Diabetes Brother    Stomach cancer Neg Hx    Colon cancer Neg Hx    Esophageal cancer Neg Hx     Social History   Socioeconomic History   Marital status: Married    Spouse name: Not on file   Number of children: Not on file   Years of education: Not on file   Highest education level: Not on file  Occupational History   Occupation: chef  Tobacco Use   Smoking status: Never    Passive exposure: Never   Smokeless tobacco:  Never  Vaping Use   Vaping status: Never Used  Substance and Sexual Activity   Alcohol use: Yes    Alcohol/week: 7.0 standard drinks of alcohol    Types: 7 Glasses of wine per week    Comment: occassionally   Drug use: Never   Sexual activity: Yes    Partners: Female  Other Topics Concern   Not on file  Social History Narrative   Married   Social Drivers of Health   Financial Resource Strain: Low Risk  (06/28/2023)   Overall Financial Resource Strain (CARDIA)    Difficulty of Paying Living Expenses: Not hard at all  Food Insecurity: No Food Insecurity (06/28/2023)   Hunger Vital Sign    Worried About Running Out of Food in the Last Year: Never true    Ran Out of Food in the Last Year: Never true  Transportation Needs: No Transportation Needs (06/28/2023)   PRAPARE - Administrator, Civil Service (Medical): No    Lack of Transportation (Non-Medical): No  Physical Activity: Sufficiently Active (06/28/2023)   Exercise Vital Sign    Days of Exercise per Week: 7 days    Minutes of Exercise per Session: 60 min  Stress: No Stress Concern Present (06/28/2023)   [employer] of Occupational Health - Occupational Stress Questionnaire    Feeling of Stress : Not at all  Social  Connections: Moderately Integrated (06/28/2023)   Social Connection and Isolation Panel    Frequency of Communication with Friends and Family: More than three times a week    Frequency of Social Gatherings with Friends and Family: More than three times a week    Attends Religious Services: Never    Database administrator or Organizations: Yes    Attends Banker Meetings: 1 to 4 times per year    Marital Status: Married  Catering manager Violence: Not At Risk (06/28/2023)   Humiliation, Afraid, Rape, and Kick questionnaire    Fear of Current or Ex-Partner: No    Emotionally Abused: No    Physically Abused: No    Sexually Abused: No    Physical Exam: Vital signs in last 24 hours: @BP  (!) 158/86   Pulse 86   Temp 98.2 F (36.8 C) (Temporal)   Ht 5' 10 (1.778 m)   Wt 185 lb (83.9 kg)   SpO2 96%   BMI 26.54 kg/m  GEN: NAD EYE: Sclerae anicteric ENT: MMM CV: Non-tachycardic Pulm: CTA b/l GI: Soft, NT/ND NEURO:  Alert & Oriented x 3   Sandor Flatter, DO [facility]   11/09/2023 9:15 AM

## 2023-11-10 ENCOUNTER — Telehealth: Payer: Self-pay

## 2023-11-10 NOTE — Telephone Encounter (Signed)
 Left message on follow up call.

## 2023-11-13 ENCOUNTER — Other Ambulatory Visit: Payer: Self-pay | Admitting: Internal Medicine

## 2023-11-13 DIAGNOSIS — K5180 Other ulcerative colitis without complications: Secondary | ICD-10-CM

## 2023-11-14 ENCOUNTER — Ambulatory Visit: Payer: Self-pay | Admitting: Gastroenterology

## 2023-11-14 LAB — SURGICAL PATHOLOGY

## 2023-11-24 ENCOUNTER — Other Ambulatory Visit: Payer: Self-pay | Admitting: Internal Medicine

## 2023-11-24 DIAGNOSIS — E119 Type 2 diabetes mellitus without complications: Secondary | ICD-10-CM

## 2023-12-14 DIAGNOSIS — H353231 Exudative age-related macular degeneration, bilateral, with active choroidal neovascularization: Secondary | ICD-10-CM | POA: Diagnosis not present

## 2023-12-14 LAB — OPHTHALMOLOGY REPORT-SCANNED

## 2023-12-18 ENCOUNTER — Other Ambulatory Visit: Payer: Self-pay | Admitting: Cardiology

## 2024-01-06 ENCOUNTER — Other Ambulatory Visit: Payer: Self-pay | Admitting: Internal Medicine

## 2024-01-06 DIAGNOSIS — I10 Essential (primary) hypertension: Secondary | ICD-10-CM

## 2024-01-11 ENCOUNTER — Ambulatory Visit (INDEPENDENT_AMBULATORY_CARE_PROVIDER_SITE_OTHER): Admitting: Internal Medicine

## 2024-01-11 ENCOUNTER — Encounter: Payer: Self-pay | Admitting: Internal Medicine

## 2024-01-11 VITALS — BP 166/86 | HR 64 | Temp 97.8°F | Resp 16 | Ht 70.0 in | Wt 195.0 lb

## 2024-01-11 DIAGNOSIS — R809 Proteinuria, unspecified: Secondary | ICD-10-CM

## 2024-01-11 DIAGNOSIS — E785 Hyperlipidemia, unspecified: Secondary | ICD-10-CM

## 2024-01-11 DIAGNOSIS — I10 Essential (primary) hypertension: Secondary | ICD-10-CM

## 2024-01-11 DIAGNOSIS — R0609 Other forms of dyspnea: Secondary | ICD-10-CM | POA: Diagnosis not present

## 2024-01-11 DIAGNOSIS — E1129 Type 2 diabetes mellitus with other diabetic kidney complication: Secondary | ICD-10-CM

## 2024-01-11 DIAGNOSIS — E042 Nontoxic multinodular goiter: Secondary | ICD-10-CM | POA: Diagnosis not present

## 2024-01-11 DIAGNOSIS — Z794 Long term (current) use of insulin: Secondary | ICD-10-CM

## 2024-01-11 DIAGNOSIS — I119 Hypertensive heart disease without heart failure: Secondary | ICD-10-CM

## 2024-01-11 LAB — BASIC METABOLIC PANEL WITH GFR
BUN: 13 mg/dL (ref 6–23)
CO2: 31 meq/L (ref 19–32)
Calcium: 9.4 mg/dL (ref 8.4–10.5)
Chloride: 103 meq/L (ref 96–112)
Creatinine, Ser: 0.61 mg/dL (ref 0.40–1.50)
GFR: 97.35 mL/min (ref >60.00)
Glucose, Bld: 181 mg/dL — ABNORMAL HIGH (ref 70–99)
Potassium: 4.1 meq/L (ref 3.5–5.1)
Sodium: 140 meq/L (ref 135–145)

## 2024-01-11 LAB — CBC WITH DIFFERENTIAL/PLATELET
Basophils Absolute: 0 K/uL (ref 0.0–0.1)
Basophils Relative: 0.5 % (ref 0.0–3.0)
Eosinophils Absolute: 0 K/uL (ref 0.0–0.7)
Eosinophils Relative: 0.9 % (ref 0.0–5.0)
HCT: 45.4 % (ref 39.0–52.0)
Hemoglobin: 15.1 g/dL (ref 13.0–17.0)
Lymphocytes Relative: 19.4 % (ref 12.0–46.0)
Lymphs Abs: 0.8 K/uL (ref 0.7–4.0)
MCHC: 33.3 g/dL (ref 30.0–36.0)
MCV: 90.5 fl (ref 78.0–100.0)
Monocytes Absolute: 0.4 K/uL (ref 0.1–1.0)
Monocytes Relative: 10 % (ref 3.0–12.0)
Neutro Abs: 2.9 K/uL (ref 1.4–7.7)
Neutrophils Relative %: 69.2 % (ref 43.0–77.0)
Platelets: 187 K/uL (ref 150.0–400.0)
RBC: 5.01 Mil/uL (ref 4.22–5.81)
RDW: 14.4 % (ref 11.5–15.5)
WBC: 4.2 K/uL (ref 4.0–10.5)

## 2024-01-11 LAB — TROPONIN I (HIGH SENSITIVITY): High Sens Troponin I: 4 ng/L (ref 2–17)

## 2024-01-11 LAB — BRAIN NATRIURETIC PEPTIDE: Pro B Natriuretic peptide (BNP): 19 pg/mL (ref 0.0–100.0)

## 2024-01-11 LAB — URINALYSIS, ROUTINE W REFLEX MICROSCOPIC
Hgb urine dipstick: NEGATIVE
Leukocytes,Ua: NEGATIVE
Nitrite: NEGATIVE
Specific Gravity, Urine: >=1.03 — AB (ref 1.000–1.030)
Total Protein, Urine: >=300 — AB
Urine Glucose: NEGATIVE
Urobilinogen, UA: 1 (ref 0.0–1.0)
pH: 5 (ref 5.0–8.0)

## 2024-01-11 LAB — HEMOGLOBIN A1C: Hgb A1c MFr Bld: 5.9 % (ref 4.6–6.5)

## 2024-01-11 LAB — TSH: TSH: 1.16 u[IU]/mL (ref 0.35–5.50)

## 2024-01-11 LAB — MICROALBUMIN / CREATININE URINE RATIO
Creatinine,U: 171.3 mg/dL
Microalb Creat Ratio: 916.8 mg/g — ABNORMAL HIGH (ref 0.0–30.0)
Microalb, Ur: 157.1 mg/dL — ABNORMAL HIGH (ref 0.0–1.9)

## 2024-01-11 NOTE — Patient Instructions (Signed)
 Hypertension, Adult High blood pressure (hypertension) is when the force of blood pumping through the arteries is too strong. The arteries are the blood vessels that carry blood from the heart throughout the body. Hypertension forces the heart to work harder to pump blood and may cause arteries to become narrow or stiff. Untreated or uncontrolled hypertension can lead to a heart attack, heart failure, a stroke, kidney disease, and other problems. A blood pressure reading consists of a higher number over a lower number. Ideally, your blood pressure should be below 120/80. The first ("top") number is called the systolic pressure. It is a measure of the pressure in your arteries as your heart beats. The second ("bottom") number is called the diastolic pressure. It is a measure of the pressure in your arteries as the heart relaxes. What are the causes? The exact cause of this condition is not known. There are some conditions that result in high blood pressure. What increases the risk? Certain factors may make you more likely to develop high blood pressure. Some of these risk factors are under your control, including: Smoking. Not getting enough exercise or physical activity. Being overweight. Having too much fat, sugar, calories, or salt (sodium) in your diet. Drinking too much alcohol. Other risk factors include: Having a personal history of heart disease, diabetes, high cholesterol, or kidney disease. Stress. Having a family history of high blood pressure and high cholesterol. Having obstructive sleep apnea. Age. The risk increases with age. What are the signs or symptoms? High blood pressure may not cause symptoms. Very high blood pressure (hypertensive crisis) may cause: Headache. Fast or irregular heartbeats (palpitations). Shortness of breath. Nosebleed. Nausea and vomiting. Vision changes. Severe chest pain, dizziness, and seizures. How is this diagnosed? This condition is diagnosed by  measuring your blood pressure while you are seated, with your arm resting on a flat surface, your legs uncrossed, and your feet flat on the floor. The cuff of the blood pressure monitor will be placed directly against the skin of your upper arm at the level of your heart. Blood pressure should be measured at least twice using the same arm. Certain conditions can cause a difference in blood pressure between your right and left arms. If you have a high blood pressure reading during one visit or you have normal blood pressure with other risk factors, you may be asked to: Return on a different day to have your blood pressure checked again. Monitor your blood pressure at home for 1 week or longer. If you are diagnosed with hypertension, you may have other blood or imaging tests to help your health care provider understand your overall risk for other conditions. How is this treated? This condition is treated by making healthy lifestyle changes, such as eating healthy foods, exercising more, and reducing your alcohol intake. You may be referred for counseling on a healthy diet and physical activity. Your health care provider may prescribe medicine if lifestyle changes are not enough to get your blood pressure under control and if: Your systolic blood pressure is above 130. Your diastolic blood pressure is above 80. Your personal target blood pressure may vary depending on your medical conditions, your age, and other factors. Follow these instructions at home: Eating and drinking  Eat a diet that is high in fiber and potassium, and low in sodium, added sugar, and fat. An example of this eating plan is called the DASH diet. DASH stands for Dietary Approaches to Stop Hypertension. To eat this way: Eat  plenty of fresh fruits and vegetables. Try to fill one half of your plate at each meal with fruits and vegetables. Eat whole grains, such as whole-wheat pasta, brown rice, or whole-grain bread. Fill about one  fourth of your plate with whole grains. Eat or drink low-fat dairy products, such as skim milk or low-fat yogurt. Avoid fatty cuts of meat, processed or cured meats, and poultry with skin. Fill about one fourth of your plate with lean proteins, such as fish, chicken without skin, beans, eggs, or tofu. Avoid pre-made and processed foods. These tend to be higher in sodium, added sugar, and fat. Reduce your daily sodium intake. Many people with hypertension should eat less than 1,500 mg of sodium a day. Do not drink alcohol if: Your health care provider tells you not to drink. You are pregnant, may be pregnant, or are planning to become pregnant. If you drink alcohol: Limit how much you have to: 0-1 drink a day for women. 0-2 drinks a day for men. Know how much alcohol is in your drink. In the [place] one drink equals one 12 oz bottle of beer (355 mL), one 5 oz glass of wine (148 mL), or one 1 oz glass of hard liquor (44 mL). Lifestyle  Work with your health care provider to maintain a healthy body weight or to lose weight. Ask what an ideal weight is for you. Get at least 30 minutes of exercise that causes your heart to beat faster (aerobic exercise) most days of the week. Activities may include walking, swimming, or biking. Include exercise to strengthen your muscles (resistance exercise), such as Pilates or lifting weights, as part of your weekly exercise routine. Try to do these types of exercises for 30 minutes at least 3 days a week. Do not use any products that contain nicotine or tobacco. These products include cigarettes, chewing tobacco, and vaping devices, such as e-cigarettes. If you need help quitting, ask your health care provider. Monitor your blood pressure at home as told by your health care provider. Keep all follow-up visits. This is important. Medicines Take over-the-counter and prescription medicines only as told by your health care provider. Follow directions carefully. Blood  pressure medicines must be taken as prescribed. Do not skip doses of blood pressure medicine. Doing this puts you at risk for problems and can make the medicine less effective. Ask your health care provider about side effects or reactions to medicines that you should watch for. Contact a health care provider if you: Think you are having a reaction to a medicine you are taking. Have headaches that keep coming back (recurring). Feel dizzy. Have swelling in your ankles. Have trouble with your vision. Get help right away if you: Develop a severe headache or confusion. Have unusual weakness or numbness. Feel faint. Have severe pain in your chest or abdomen. Vomit repeatedly. Have trouble breathing. These symptoms may be an emergency. Get help right away. Call 911. Do not wait to see if the symptoms will go away. Do not drive yourself to the hospital. Summary Hypertension is when the force of blood pumping through your arteries is too strong. If this condition is not controlled, it may put you at risk for serious complications. Your personal target blood pressure may vary depending on your medical conditions, your age, and other factors. For most people, a normal blood pressure is less than 120/80. Hypertension is treated with lifestyle changes, medicines, or a combination of both. Lifestyle changes include losing weight, eating a healthy,  low-sodium diet, exercising more, and limiting alcohol. This information is not intended to replace advice given to you by your health care provider. Make sure you discuss any questions you have with your health care provider. Document Revised: 12/23/2020 Document Reviewed: 12/23/2020 Elsevier Patient Education  2024 [employer].

## 2024-01-11 NOTE — Progress Notes (Unsigned)
 Subjective:  Patient ID: Bruce Sanchez, male    DOB: [birth year]  Age: 68 y.o. MRN: [id number]  CC: Hypertension   HPI Braheem Skeels presents for f/up ---  Discussed the use of AI scribe software for clinical note transcription with the patient, who gave verbal consent to proceed.  History of Present Illness Bruce Sanchez is a 68 year old male with hypertension and diabetes who presents with concerns about high blood pressure and blood sugar levels.  He has been experiencing elevated blood pressure readings, particularly during clinic visits. He is unsure of the cause. No chest pain is reported, but he experiences occasional shortness of breath, especially when anxious at work.  His diabetes management includes insulin  therapy. Blood sugar levels have been fluctuating, with recent readings of 142 mg/dL and 849 mg/dL, and previously as high as 170-180 mg/dL. He attributes some fluctuations to consuming Halloween candy. No symptoms of hyperglycemia such as excessive thirst or urination, and no hypoglycemia.  He experiences difficulty closing his right hand upon waking, which he attributes to arthritis or sleeping position. There is discomfort but no numbness or tingling.  He denies any swelling in his legs or feet.  He does not receive flu or COVID vaccines due to personal preferences. No headache, blurred vision, chest pain, or shortness of breath during physical activity, although he reports occasional shortness of breath related to anxiety at work.     Outpatient Medications Prior to Visit  Medication Sig Dispense Refill   amLODipine  (NORVASC ) 10 MG tablet TAKE 1 TABLET BY MOUTH DAILY 30 tablet 0   Blood Glucose Monitoring Suppl DEVI 1 each by Does not apply route in the morning, at noon, and at bedtime. May substitute to any manufacturer covered by patient's insurance. 1 each 0   dicyclomine (BENTYL) 10 MG capsule Take by mouth.     DROPLET PEN NEEDLES 32G X 6 MM MISC USE TO INJECT  INSULIN  ONCE DAILY 100 each 0   Glucose Blood (BLOOD GLUCOSE TEST STRIPS) STRP Use to check blood glucose up to 3 times daily. May substitute to any manufacturer covered by patient's insurance. 100 strip 11   hydrocortisone  (ANUSOL -HC) 2.5 % rectal cream Place 1 Application rectally daily as needed for hemorrhoids or anal itching. 30 g 0   Lancets Misc. MISC Use to check blood glucose up to 3 times daily. May substitute to any manufacturer covered by patient's insurance. 100 each 0   mesalamine  (CANASA ) 1000 MG suppository Place 1 suppository (1,000 mg total) rectally at bedtime. 30 suppository 12   metoprolol  succinate (TOPROL -XL) 50 MG 24 hr tablet TAKE 1 TABLET BY MOUTH DAILY WITH OR IMMEDIATELY AFTER A MEAL 90 tablet 0   ondansetron  (ZOFRAN ) 4 MG tablet Take 1 tablet (4 mg total) by mouth every 6 (six) hours as needed for nausea or vomiting. 30 tablet 0   pramoxine (PROCTOFOAM) 1 % foam Place 1 Application rectally 3 (three) times daily as needed for anal itching. 15 g 2   sulfaSALAzine  (AZULFIDINE ) 500 MG tablet Take 2 tablets (1,000 mg total) by mouth 3 (three) times daily. 180 tablet 3   rosuvastatin  (CRESTOR ) 20 MG tablet Take 1 tablet (20 mg total) by mouth daily. 90 tablet 1   Sodium Sulfate-Mag Sulfate-KCl (SUTAB ) 1479-225-188 MG TABS Use as directed for colonoscopy. MANUFACTURER CODES!! BIN: [id number] PCN: CN GROUP: [id number] MEMBER ID: [id number];MLW AS SECONDARY INSURANCE ;NO PRIOR AUTHORIZATION 24 tablet 0   TOUJEO  MAX SOLOSTAR 300 UNIT/ML Solostar Pen INJECT 20 UNITS  INTO THE SKIN DAILY 6 mL 0   No facility-administered medications prior to visit.    ROS Review of Systems  Objective:  BP (!) 166/86 (BP Location: Left Arm, Patient Position: Sitting, Cuff Size: Normal) Comment: BP (R) 162/82  Pulse 64   Temp 97.8 F (36.6 C) (Oral)   Resp 16   Ht 5' 10 (1.778 m)   Wt 195 lb (88.5 kg)   SpO2 97%   BMI 27.98 kg/m   BP Readings from Last 3 Encounters:  01/11/24 (!) 166/86   11/09/23 (!) 146/73  10/12/23 (!) 142/78    Wt Readings from Last 3 Encounters:  01/11/24 195 lb (88.5 kg)  11/09/23 185 lb (83.9 kg)  10/12/23 186 lb 9.6 oz (84.6 kg)    Physical Exam Cardiovascular:     Rate and Rhythm: Normal rate and regular rhythm.     Heart sounds: Murmur heard.     Systolic murmur is present with a grade of 2/6.     No diastolic murmur is present.     No friction rub. No gallop.     Comments: EKG--- NSR, 60 bpm Incomplete RBBB Minimal LVH Anterior infarct pattern - more prominent  Musculoskeletal:     Right lower leg: No edema.     Left lower leg: No edema.     Lab Results  Component Value Date   WBC 4.2 01/11/2024   HGB 15.1 01/11/2024   HCT 45.4 01/11/2024   PLT 187.0 01/11/2024   GLUCOSE 181 (H) 01/11/2024   CHOL 115 08/03/2023   TRIG 170.0 (H) 08/03/2023   HDL 39.80 08/03/2023   LDLDIRECT 126.0 08/18/2022   LDLCALC 42 08/03/2023   ALT 23 08/03/2023   AST 21 08/03/2023   NA 140 01/11/2024   K 4.1 01/11/2024   CL 103 01/11/2024   CREATININE 0.61 01/11/2024   BUN 13 01/11/2024   CO2 31 01/11/2024   TSH 1.16 01/11/2024   PSA 1.13 08/18/2022   HGBA1C 5.9 01/11/2024   MICROALBUR 157.1 (H) 01/11/2024    CT Chest Wo Contrast Result Date: 03/01/2023 CLINICAL DATA:  Follow-up of sternal lesion. EXAM: CT CHEST WITHOUT CONTRAST TECHNIQUE: Multidetector CT imaging of the chest was performed following the standard protocol without IV contrast. RADIATION DOSE REDUCTION: This exam was performed according to the departmental dose-optimization program which includes automated exposure control, adjustment of the mA and/or kV according to patient size and/or use of iterative reconstruction technique. COMPARISON:  08/31/2022 FINDINGS: Cardiovascular: Aortic atherosclerosis. Borderline cardiomegaly, without pericardial effusion. Left main and 3 vessel coronary artery calcification. Aortic valve calcification. Pulmonary artery enlargement, outflow tract  3.1 cm. Mediastinum/Nodes: Thyroid  enlargement and heterogeneity. This has been evaluated on previous imaging. (ref: J Am Coll Radiol. 2015 Feb;12(2): 143-50). No mediastinal or hilar adenopathy, given limitations of unenhanced CT. Lungs/Pleura: No pleural fluid. Right lower lobe scarring laterally with right hemidiaphragm elevation. Tiny subpleural lymph nodes again identified along the left major fissure. No suspicious pulmonary nodule or mass. Upper Abdomen: Normal imaged portions of the liver, spleen, stomach, pancreas, gallbladder, adrenal glands, kidneys. Musculoskeletal: Diffuse idiopathic skeletal hyperostosis throughout the thoracic spine. 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 The sternal body primarily lytic lesion measures 3.0 x 2.0 cm on 135/6 versus similar (when remeasured). Sharp transition, without aggressive features. Similar trabeculation within the inferior portion. IMPRESSION: 1. Similar size and morphology of a sternal body lesion over nearly 6 months. Given morphology and stability, favored to represent a hemangioma . In the absence of  clinically indicated signs/symptoms require(s) no independent follow-up. 2.  No acute process in the chest. 3. Pulmonary artery enlargement suggests pulmonary arterial hypertension. 4. Aortic valvular calcifications. Consider echocardiography to evaluate for valvular dysfunction. 5. Coronary artery atherosclerosis. Aortic Atherosclerosis (ICD10-I70.0). Electronically Signed   By: Rockey Kilts M.D.   On: 03/01/2023 14:17    Assessment & Plan:  Primary hypertension -     Basic metabolic panel with GFR; Future -     CBC with Differential/Platelet; Future -     Urinalysis, Routine w reflex microscopic; Future -     EKG 12-Lead -     Aldosterone + renin activity w/ ratio; Future -     US  RENAL ARTERY DUPLEX COMPLETE; Future -     hydrALAZINE HCl; Take 1 tablet (25 mg total) by mouth 3 (three) times daily.  Dispense: 270 tablet; Refill: 0 -      Indapamide; Take 1 tablet (1.25 mg total) by mouth daily.  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) -     Basic metabolic panel with GFR; Future -     Hemoglobin A1c; Future -     CBC with Differential/Platelet; Future -     Microalbumin / creatinine urine ratio; Future -     Urinalysis, Routine w reflex microscopic; Future  DOE (dyspnea on exertion) -     Brain natriuretic peptide; Future -     Troponin I (High Sensitivity); Future -     CT CORONARY MORPH W/CTA COR W/SCORE W/CA W/CM &/OR WO/CM; Future  Hypertensive left ventricular hypertrophy, without heart failure -     US  RENAL ARTERY DUPLEX COMPLETE; Future -     hydrALAZINE HCl; Take 1 tablet (25 mg total) by mouth 3 (three) times daily.  Dispense: 270 tablet; Refill: 0 -     Indapamide; Take 1 tablet (1.25 mg total) by mouth daily.  Dispense: 90 tablet; Refill: 0 -     AMB Referral VBCI Care Management  Multiple thyroid  nodules -     TSH; Future  Hyperlipidemia LDL goal <70 -     Rosuvastatin  Calcium ; Take 1 tablet (20 mg total) by mouth daily.  Dispense: 90 tablet; Refill: 1     Follow-up: Return in about 3 months (around 04/12/2024).  Debby Molt, MD

## 2024-01-12 ENCOUNTER — Ambulatory Visit: Admitting: Internal Medicine

## 2024-01-12 ENCOUNTER — Ambulatory Visit: Payer: Self-pay | Admitting: Internal Medicine

## 2024-01-12 MED ORDER — INDAPAMIDE 1.25 MG PO TABS: 1.2500 mg | ORAL_TABLET | Freq: Every day | ORAL | 0 refills | Status: AC

## 2024-01-12 MED ORDER — HYDRALAZINE HCL 25 MG PO TABS: 25.0000 mg | ORAL_TABLET | Freq: Three times a day (TID) | ORAL | 0 refills | Status: AC

## 2024-01-12 MED ORDER — ROSUVASTATIN CALCIUM 20 MG PO TABS: 20.0000 mg | ORAL_TABLET | Freq: Every day | ORAL | 1 refills | Status: AC

## 2024-01-16 ENCOUNTER — Telehealth: Payer: Self-pay | Admitting: *Deleted

## 2024-01-16 NOTE — Progress Notes (Unsigned)
 Care Guide Pharmacy Note  01/16/2024 Name: Bruce Sanchez MRN: [id number] DOB: 1955-07-09  Referred By: Joshua Debby CROME, MD Reason for referral: Call Attempt #1 and Complex Care Management (Outreach to schedule referral with pharmacist )   Bruce Sanchez is a 68 y.o. year old male who is a primary care patient of Joshua Debby CROME, MD.  Bruce Sanchez was referred to the pharmacist for assistance related to: HTN  An unsuccessful telephone outreach was attempted today to contact the patient who was referred to the pharmacy team for assistance with medication management. Additional attempts will be made to contact the patient.  Bruce Sanchez, CMA [place]  [facility], [facility] Guide Direct Dial: [phone]  Fax: [phone] Website: [email or web address]

## 2024-01-17 ENCOUNTER — Other Ambulatory Visit: Payer: Self-pay | Admitting: Cardiology

## 2024-01-17 NOTE — Progress Notes (Signed)
 Pharmacist scheduled pt for 12/3

## 2024-01-17 NOTE — Progress Notes (Signed)
 Care Guide Pharmacy Note  01/17/2024 Name: Bruce Sanchez MRN: [id number] DOB: 1955/09/18  Referred By: Joshua Debby CROME, MD Reason for referral: Call Attempt #1 and Complex Care Management (Outreach to schedule referral with pharmacist )   Bruce Sanchez is a 68 y.o. year old male who is a primary care patient of Joshua Debby CROME, MD.  Bruce Sanchez was referred to the pharmacist for assistance related to: HTN  A second unsuccessful telephone outreach was attempted today to contact the patient who was referred to the pharmacy team for assistance with medication management. Additional attempts will be made to contact the patient.  Bruce Sanchez, CMA [place]  [facility], [facility] Guide Direct Dial: [phone]  Fax: [phone] Website: [email or web address]

## 2024-01-23 ENCOUNTER — Other Ambulatory Visit: Payer: Self-pay | Admitting: Internal Medicine

## 2024-01-23 DIAGNOSIS — E119 Type 2 diabetes mellitus without complications: Secondary | ICD-10-CM

## 2024-01-24 ENCOUNTER — Other Ambulatory Visit

## 2024-01-24 NOTE — Telephone Encounter (Signed)
 Discontinued by PCP. Please advise.

## 2024-01-25 ENCOUNTER — Ambulatory Visit: Admitting: Cardiology

## 2024-01-26 LAB — ALDOSTERONE + RENIN ACTIVITY W/ RATIO
ALDO / PRA Ratio: 1.3 ratio (ref 0.9–28.9)
Aldosterone: 2 ng/dL
Renin Activity: 1.56 ng/mL/h (ref 0.25–5.82)

## 2024-01-31 ENCOUNTER — Other Ambulatory Visit: Payer: Self-pay | Admitting: Cardiology

## 2024-02-01 ENCOUNTER — Other Ambulatory Visit: Admitting: Pharmacist

## 2024-02-01 ENCOUNTER — Other Ambulatory Visit (HOSPITAL_BASED_OUTPATIENT_CLINIC_OR_DEPARTMENT_OTHER): Payer: Self-pay

## 2024-02-01 ENCOUNTER — Other Ambulatory Visit: Payer: Self-pay

## 2024-02-01 DIAGNOSIS — E119 Type 2 diabetes mellitus without complications: Secondary | ICD-10-CM

## 2024-02-01 DIAGNOSIS — I10 Essential (primary) hypertension: Secondary | ICD-10-CM

## 2024-02-01 DIAGNOSIS — E1129 Type 2 diabetes mellitus with other diabetic kidney complication: Secondary | ICD-10-CM

## 2024-02-01 MED ORDER — DROPLET PEN NEEDLES 32G X 6 MM MISC
0 refills | Status: AC
  Filled 2024-02-01 – 2024-02-07 (×2): qty 100, 100d supply, fill #0

## 2024-02-01 MED ORDER — METFORMIN HCL ER 750 MG PO TB24
750.0000 mg | ORAL_TABLET | Freq: Two times a day (BID) | ORAL | 1 refills | Status: AC
  Filled 2024-02-01 – 2024-02-14 (×2): qty 180, 90d supply, fill #0

## 2024-02-01 MED ORDER — TOUJEO MAX SOLOSTAR 300 UNIT/ML ~~LOC~~ SOPN
22.0000 [IU] | PEN_INJECTOR | Freq: Every day | SUBCUTANEOUS | 1 refills | Status: DC
  Filled 2024-02-01: qty 6, 81d supply, fill #0

## 2024-02-01 NOTE — Patient Instructions (Addendum)
 It was a pleasure speaking with you today!  Continue your current regimen.  - Recommended to check BG twice a day, at different times of the day to see if BG is lower or higher at different times.  - Check home BP readings   Feel free to call with any questions or concerns!  Darrelyn Drum, PharmD, BCPS, CPP Clinical Pharmacist Practitioner [facility] [phone]

## 2024-02-01 NOTE — Progress Notes (Signed)
 02/01/2024 Name: Bruce Sanchez MRN: [id number] DOB: [birth year]  Chief Complaint  Patient presents with   Hypertension   Diabetes   Medication Management   Medication Assistance    Bruce Sanchez is a 68 y.o. year old male who presented for a telephone visit.   They were referred to the pharmacist by their PCP for assistance in managing hypertension.   Subjective:  Care Team: Primary Care Provider: Joshua Debby CROME, MD ; Next Scheduled Visit: 04/17/24  Medication Access/Adherence  Current Pharmacy:  [facility] [id number] - [place], [place] - [address] [place] [address] Phone: [phone] Fax: [phone]  [facility] [place] [facility] [address] [place] [address] Phone: [phone] Fax: [phone]   Patient reports affordability concerns with their medications: Yes  Patient reports access/transportation concerns to their pharmacy: No  Patient reports adherence concerns with their medications:  Yes    Notes Toujeo  is expensive ($105 for 90DS) and pen needles are expensive ($50)  Diabetes:  Current medications: Toujeo  22 units daily, metformion ER 750 mg twice daily Medications tried in the past: Synjardy  (nausea, diarrhea)  *Pt did not stop Toujeo  because home BG have been 200 or higher lately Pt restarted metformin  ER that he had from previous Rx on his own due to elevated BG over the last ~1 month  Pt denies change to diet. Possibly slightly less active due to recent eye surgery 2 weeks ago, however BG have been elevated since prior to surgery.  Current glucose readings: Used to be 130-150 fasting Fasting: 197 this morning, 187 yesterday, 11/30 377, 183, 195, 194, 11/5 189   Patient denies hypoglycemic s/sx including dizziness, shakiness, sweating. Patient denies hyperglycemic symptoms including polyuria, polydipsia, polyphagia, nocturia, neuropathy, blurred vision.   Macrovascular and  Microvascular Risk Reduction:  Statin? yes (rosuvastatin ); ACEi/ARB? Not currently, hx of rash with olmesartan  Last urinary albumin/creatinine ratio:  Lab Results  Component Value Date   MICRALBCREAT 916.8 (H) 01/11/2024   MICRALBCREAT 581.7 (H) 08/03/2023   MICRALBCREAT 197 06/03/2020   Last eye exam:  Lab Results  Component Value Date   HMDIABEYEEXA Retinopathy (A) 12/14/2023   Last foot exam: 08/03/2023 Tobacco Use:  Tobacco Use: Low Risk  (01/11/2024)   Patient History    Smoking Tobacco Use: Never    Smokeless Tobacco Use: Never    Passive Exposure: Never   Hypertension:  Current medications: Amlodipine  10 mg daily, metoprolol  succinate 50 mg daily, indapamide  1.25 mg daily, hydralazine  25 mg TID Medications previously tried: Olmesartan  (rash)  Patient has a validated, automated, upper arm home BP cuff Current blood pressure readings readings: has not been checking  Patient denies hypotensive s/sx including dizziness, lightheadedness.  Patient denies hypertensive symptoms including headache, chest pain, shortness of breath   Objective:  BP Readings from Last 3 Encounters:  01/11/24 (!) 166/86  11/09/23 (!) 146/73  10/12/23 (!) 142/78     Lab Results  Component Value Date   HGBA1C 5.9 01/11/2024    Lab Results  Component Value Date   CREATININE 0.61 01/11/2024   BUN 13 01/11/2024   NA 140 01/11/2024   K 4.1 01/11/2024   CL 103 01/11/2024   CO2 31 01/11/2024    Lab Results  Component Value Date   CHOL 115 08/03/2023   HDL 39.80 08/03/2023   LDLCALC 42 08/03/2023   LDLDIRECT 126.0 08/18/2022   TRIG 170.0 (H) 08/03/2023   CHOLHDL 3 08/03/2023    Medications Reviewed Today  Reviewed by Merceda Lela SAUNDERS, RPH (Pharmacist) on 02/01/24 at 1605  Med List Status: <None>   Medication Order Taking? Sig Documenting Provider Last Dose Status Informant  amLODipine  (NORVASC ) 10 MG tablet [id number] Yes TAKE 1 TABLET BY MOUTH DAILY Hochrein, James,  MD  Active   Blood Glucose Monitoring Suppl DEVI [id number]  1 each by Does not apply route in the morning, at noon, and at bedtime. May substitute to any manufacturer covered by patient's insurance. Joshua Debby CROME, MD  Active   dicyclomine (BENTYL) 10 MG capsule [id number]  Take by mouth. [provider]  Expired 01/11/24 2359            Med Note SOUNDRA, EBONY J   Wed Sep 02, 2021 11:01 AM) Patient takes as needed  DROPLET PEN NEEDLES 32G X 6 MM MISC [id number]  USE TO INJECT INSULIN  ONCE DAILY Joshua Debby CROME, MD  Active   Glucose Blood (BLOOD GLUCOSE TEST STRIPS) STRP [id number]  Use to check blood glucose up to 3 times daily. May substitute to any manufacturer covered by patient's insurance. Joshua Debby CROME, MD  Active   hydrALAZINE  (APRESOLINE ) 25 MG tablet [id number] Yes Take 1 tablet (25 mg total) by mouth 3 (three) times daily. Joshua Debby CROME, MD  Active   hydrocortisone  (ANUSOL -HC) 2.5 % rectal cream [id number]  Place 1 Application rectally daily as needed for hemorrhoids or anal itching. Cirigliano, Vito V, DO  Active   indapamide  (LOZOL ) 1.25 MG tablet [id number] Yes Take 1 tablet (1.25 mg total) by mouth daily. Joshua Debby CROME, MD  Active   insulin  glargine, 2 Unit Dial, (TOUJEO  MAX SOLOSTAR) 300 UNIT/ML Solostar Pen [id number] Yes Inject 22 Units into the skin daily. [provider]  Active   Lancets Misc. MISC [id number]  Use to check blood glucose up to 3 times daily. May substitute to any manufacturer covered by patient's insurance. Joshua Debby CROME, MD  Active   mesalamine  (CANASA ) 1000 MG suppository [id number]  Place 1 suppository (1,000 mg total) rectally at bedtime. Cirigliano, Vito V, DO  Active   metFORMIN  (GLUCOPHAGE -XR) 750 MG 24 hr tablet [id number] Yes Take 750 mg by mouth 2 (two) times daily. [provider]  Active   metoprolol  succinate (TOPROL -XL) 50 MG 24 hr tablet [id number] Yes TAKE 1 TABLET BY MOUTH DAILY WITH OR IMMEDIATELY AFTER A MEAL Joshua Debby CROME, MD  Active   ondansetron  (ZOFRAN ) 4 MG tablet [id number]  Take 1 tablet (4 mg total) by mouth every 6 (six) hours as needed for nausea or vomiting. Cirigliano, Vito V, DO  Active   pramoxine (PROCTOFOAM) 1 % foam [id number]  Place 1 Application rectally 3 (three) times daily as needed for anal itching. Joshua Debby CROME, MD  Active   rosuvastatin  (CRESTOR ) 20 MG tablet [id number] Yes Take 1 tablet (20 mg total) by mouth daily. Joshua Debby CROME, MD  Active   sulfaSALAzine  (AZULFIDINE ) 500 MG tablet [id number] Yes Take 2 tablets (1,000 mg total) by mouth 3 (three) times daily. Joshua Debby CROME, MD  Active               Assessment/Plan:   Diabetes: - Currently controlled; goal A1c <7%. There is a discrepancy between recent A1c and elevated BG at home.  Cardiorenal risk reduction is opportunities for improvement.. Blood pressure is not at goal <130/80. LDL is at goal.  - Recommend to continue Toujeo  22 units daily and metformin  ER 750 mg twice daily. Sent Rxs for these to  pharmacy. Sent to [facility] to see if can get pen needles cheaper. - Will apply for PAP for basal insulin  - change to Basaglar  to get through [employer] - Recommended to check BG twice a day, at different times of the day to see if BG is lower/higher at different times.   Hypertension: - Currently uncontrolled, BP goal <130/80 - Reviewed long term cardiovascular and renal outcomes of uncontrolled blood pressure - Reviewed appropriate blood pressure monitoring technique and reviewed goal blood pressure. Recommended to check home blood pressure and heart rate  - Recommend to continue current regimen. - Will f/u for BP readings to assess BP control w/ new medications that were recently added     Follow Up Plan: 12/16  Darrelyn Drum, PharmD, BCPS, CPP Clinical Pharmacist Practitioner [facility] [phone]

## 2024-02-02 ENCOUNTER — Encounter (HOSPITAL_COMMUNITY): Payer: Self-pay

## 2024-02-06 ENCOUNTER — Other Ambulatory Visit (HOSPITAL_BASED_OUTPATIENT_CLINIC_OR_DEPARTMENT_OTHER): Payer: Self-pay

## 2024-02-06 ENCOUNTER — Telehealth: Payer: Self-pay | Admitting: Pharmacist

## 2024-02-06 ENCOUNTER — Ambulatory Visit (HOSPITAL_COMMUNITY)
Admission: RE | Admit: 2024-02-06 | Discharge: 2024-02-06 | Disposition: A | Source: Ambulatory Visit | Attending: Internal Medicine | Admitting: Internal Medicine

## 2024-02-06 DIAGNOSIS — R0609 Other forms of dyspnea: Secondary | ICD-10-CM

## 2024-02-06 DIAGNOSIS — E1129 Type 2 diabetes mellitus with other diabetic kidney complication: Secondary | ICD-10-CM

## 2024-02-06 MED ORDER — NITROGLYCERIN 0.4 MG SL SUBL
0.8000 mg | SUBLINGUAL_TABLET | Freq: Once | SUBLINGUAL | Status: AC
  Administered 2024-02-06: 0.8 mg via SUBLINGUAL

## 2024-02-06 MED ORDER — IOHEXOL 350 MG/ML SOLN
100.0000 mL | Freq: Once | INTRAVENOUS | Status: AC | PRN
  Administered 2024-02-06: 100 mL via INTRAVENOUS

## 2024-02-06 MED ORDER — METOPROLOL TARTRATE 5 MG/5ML IV SOLN
10.0000 mg | INTRAVENOUS | Status: DC | PRN
  Administered 2024-02-06: 10 mg via INTRAVENOUS

## 2024-02-06 MED ORDER — BASAGLAR KWIKPEN 100 UNIT/ML ~~LOC~~ SOPN: 22.0000 [IU] | PEN_INJECTOR | Freq: Every day | SUBCUTANEOUS | Status: DC

## 2024-02-06 MED ORDER — DILTIAZEM HCL 25 MG/5ML IV SOLN: 10.0000 mg | INTRAVENOUS | Status: DC | PRN

## 2024-02-06 NOTE — Progress Notes (Signed)
 Received patient portion with signatures, printed medication list/allergies, received provider signatures, scanned and faxed to [facility] and to med assistance fax line.  Darrelyn Drum, PharmD, BCPS, CPP Clinical Pharmacist Practitioner [facility] [phone]

## 2024-02-07 ENCOUNTER — Other Ambulatory Visit (HOSPITAL_BASED_OUTPATIENT_CLINIC_OR_DEPARTMENT_OTHER): Payer: Self-pay

## 2024-02-08 ENCOUNTER — Other Ambulatory Visit: Payer: Self-pay | Admitting: Internal Medicine

## 2024-02-08 ENCOUNTER — Encounter: Payer: Self-pay | Admitting: Internal Medicine

## 2024-02-08 DIAGNOSIS — R931 Abnormal findings on diagnostic imaging of heart and coronary circulation: Secondary | ICD-10-CM | POA: Insufficient documentation

## 2024-02-08 DIAGNOSIS — I25118 Atherosclerotic heart disease of native coronary artery with other forms of angina pectoris: Secondary | ICD-10-CM | POA: Insufficient documentation

## 2024-02-08 MED ORDER — ISOSORBIDE MONONITRATE ER 30 MG PO TB24: 30.0000 mg | ORAL_TABLET | Freq: Every day | ORAL | 0 refills | Status: DC

## 2024-02-09 NOTE — Progress Notes (Signed)
 Ask him to read his mychart message

## 2024-02-10 ENCOUNTER — Telehealth: Payer: Self-pay

## 2024-02-10 ENCOUNTER — Encounter: Payer: Self-pay | Admitting: Cardiology

## 2024-02-10 ENCOUNTER — Ambulatory Visit: Attending: Cardiology | Admitting: Cardiology

## 2024-02-10 ENCOUNTER — Observation Stay (HOSPITAL_COMMUNITY)
Admission: EM | Admit: 2024-02-10 | Discharge: 2024-02-11 | Disposition: A | Attending: Emergency Medicine | Admitting: Emergency Medicine

## 2024-02-10 ENCOUNTER — Encounter (HOSPITAL_COMMUNITY): Payer: Self-pay

## 2024-02-10 ENCOUNTER — Other Ambulatory Visit: Payer: Self-pay

## 2024-02-10 ENCOUNTER — Emergency Department (HOSPITAL_COMMUNITY)

## 2024-02-10 VITALS — BP 138/50 | HR 76 | Ht 70.0 in | Wt 188.0 lb

## 2024-02-10 DIAGNOSIS — Z8719 Personal history of other diseases of the digestive system: Secondary | ICD-10-CM | POA: Diagnosis not present

## 2024-02-10 DIAGNOSIS — E119 Type 2 diabetes mellitus without complications: Secondary | ICD-10-CM | POA: Diagnosis not present

## 2024-02-10 DIAGNOSIS — K5180 Other ulcerative colitis without complications: Secondary | ICD-10-CM

## 2024-02-10 DIAGNOSIS — E1159 Type 2 diabetes mellitus with other circulatory complications: Secondary | ICD-10-CM

## 2024-02-10 DIAGNOSIS — I25118 Atherosclerotic heart disease of native coronary artery with other forms of angina pectoris: Secondary | ICD-10-CM | POA: Diagnosis not present

## 2024-02-10 DIAGNOSIS — R0789 Other chest pain: Secondary | ICD-10-CM | POA: Diagnosis not present

## 2024-02-10 DIAGNOSIS — I10 Essential (primary) hypertension: Secondary | ICD-10-CM

## 2024-02-10 DIAGNOSIS — Z7982 Long term (current) use of aspirin: Secondary | ICD-10-CM | POA: Insufficient documentation

## 2024-02-10 DIAGNOSIS — Z794 Long term (current) use of insulin: Secondary | ICD-10-CM | POA: Insufficient documentation

## 2024-02-10 DIAGNOSIS — I2584 Coronary atherosclerosis due to calcified coronary lesion: Secondary | ICD-10-CM | POA: Diagnosis not present

## 2024-02-10 DIAGNOSIS — I350 Nonrheumatic aortic (valve) stenosis: Secondary | ICD-10-CM

## 2024-02-10 DIAGNOSIS — E785 Hyperlipidemia, unspecified: Secondary | ICD-10-CM

## 2024-02-10 DIAGNOSIS — I209 Angina pectoris, unspecified: Secondary | ICD-10-CM

## 2024-02-10 DIAGNOSIS — I25119 Atherosclerotic heart disease of native coronary artery with unspecified angina pectoris: Secondary | ICD-10-CM

## 2024-02-10 DIAGNOSIS — R079 Chest pain, unspecified: Secondary | ICD-10-CM | POA: Diagnosis present

## 2024-02-10 DIAGNOSIS — R0609 Other forms of dyspnea: Secondary | ICD-10-CM | POA: Diagnosis not present

## 2024-02-10 DIAGNOSIS — Z79899 Other long term (current) drug therapy: Secondary | ICD-10-CM | POA: Diagnosis not present

## 2024-02-10 DIAGNOSIS — E1129 Type 2 diabetes mellitus with other diabetic kidney complication: Secondary | ICD-10-CM

## 2024-02-10 DIAGNOSIS — R0602 Shortness of breath: Secondary | ICD-10-CM | POA: Diagnosis present

## 2024-02-10 DIAGNOSIS — I119 Hypertensive heart disease without heart failure: Secondary | ICD-10-CM

## 2024-02-10 LAB — CBC
HCT: 44.9 % (ref 39.0–52.0)
Hemoglobin: 15.3 g/dL (ref 13.0–17.0)
MCH: 30.8 pg (ref 26.0–34.0)
MCHC: 34.1 g/dL (ref 30.0–36.0)
MCV: 90.5 fL (ref 80.0–100.0)
Platelets: 208 K/uL (ref 150–400)
RBC: 4.96 MIL/uL (ref 4.22–5.81)
RDW: 12.9 % (ref 11.5–15.5)
WBC: 7.5 K/uL (ref 4.0–10.5)
nRBC: 0 % (ref 0.0–0.2)

## 2024-02-10 LAB — BRAIN NATRIURETIC PEPTIDE: B Natriuretic Peptide: 24.5 pg/mL (ref 0.0–100.0)

## 2024-02-10 LAB — CBG MONITORING, ED: Glucose-Capillary: 154 mg/dL — ABNORMAL HIGH (ref 70–99)

## 2024-02-10 LAB — TROPONIN I (HIGH SENSITIVITY)
Troponin I (High Sensitivity): 4 ng/L (ref <18)
Troponin I (High Sensitivity): 4 ng/L (ref <18)

## 2024-02-10 LAB — BASIC METABOLIC PANEL WITH GFR
Anion gap: 9 (ref 5–15)
BUN: 14 mg/dL (ref 8–23)
CO2: 30 mmol/L (ref 22–32)
Calcium: 8.8 mg/dL — ABNORMAL LOW (ref 8.9–10.3)
Chloride: 98 mmol/L (ref 98–111)
Creatinine, Ser: 0.56 mg/dL — ABNORMAL LOW (ref 0.61–1.24)
GFR, Estimated: >60 mL/min (ref >60)
Glucose, Bld: 262 mg/dL — ABNORMAL HIGH (ref 70–99)
Potassium: 3.7 mmol/L (ref 3.5–5.1)
Sodium: 137 mmol/L (ref 135–145)

## 2024-02-10 LAB — HIV ANTIBODY (ROUTINE TESTING W REFLEX): HIV Screen 4th Generation wRfx: NONREACTIVE

## 2024-02-10 LAB — HEPARIN LEVEL (UNFRACTIONATED): Heparin Unfractionated: <0.1 [IU]/mL — ABNORMAL LOW (ref 0.30–0.70)

## 2024-02-10 MED ORDER — ASPIRIN 81 MG PO TBEC
81.0000 mg | DELAYED_RELEASE_TABLET | Freq: Every day | ORAL | Status: DC
  Administered 2024-02-11: 81 mg via ORAL
  Filled 2024-02-10: qty 1

## 2024-02-10 MED ORDER — ALBUTEROL SULFATE (2.5 MG/3ML) 0.083% IN NEBU: 2.5000 mg | INHALATION_SOLUTION | RESPIRATORY_TRACT | Status: DC | PRN

## 2024-02-10 MED ORDER — HEPARIN (PORCINE) 25000 UT/250ML-% IV SOLN
1100.0000 [IU]/h | INTRAVENOUS | Status: DC
  Filled 2024-02-10: qty 250

## 2024-02-10 MED ORDER — INSULIN GLARGINE 100 UNIT/ML ~~LOC~~ SOLN
20.0000 [IU] | Freq: Every day | SUBCUTANEOUS | Status: DC
  Administered 2024-02-10: 20 [IU] via SUBCUTANEOUS
  Filled 2024-02-10 (×2): qty 0.2

## 2024-02-10 MED ORDER — METOPROLOL SUCCINATE ER 25 MG PO TB24
50.0000 mg | ORAL_TABLET | Freq: Every day | ORAL | Status: DC
  Administered 2024-02-11: 50 mg via ORAL
  Filled 2024-02-10: qty 2

## 2024-02-10 MED ORDER — HEPARIN BOLUS VIA INFUSION
4000.0000 [IU] | Freq: Once | INTRAVENOUS | Status: DC
  Filled 2024-02-10: qty 4000

## 2024-02-10 MED ORDER — HEPARIN SODIUM (PORCINE) 5000 UNIT/ML IJ SOLN
5000.0000 [IU] | Freq: Three times a day (TID) | INTRAMUSCULAR | Status: DC
  Administered 2024-02-11: 5000 [IU] via SUBCUTANEOUS
  Filled 2024-02-10 (×2): qty 1

## 2024-02-10 MED ORDER — HYDRALAZINE HCL 25 MG PO TABS
25.0000 mg | ORAL_TABLET | Freq: Three times a day (TID) | ORAL | Status: DC
  Administered 2024-02-10 – 2024-02-11 (×2): 25 mg via ORAL
  Filled 2024-02-10 (×2): qty 1

## 2024-02-10 MED ORDER — AMLODIPINE BESYLATE 5 MG PO TABS
10.0000 mg | ORAL_TABLET | Freq: Every day | ORAL | Status: DC
  Administered 2024-02-11: 10 mg via ORAL
  Filled 2024-02-10: qty 2

## 2024-02-10 MED ORDER — ROSUVASTATIN CALCIUM 20 MG PO TABS
20.0000 mg | ORAL_TABLET | Freq: Every day | ORAL | Status: DC
  Administered 2024-02-10 – 2024-02-11 (×2): 20 mg via ORAL
  Filled 2024-02-10 (×2): qty 1

## 2024-02-10 MED ORDER — OXYCODONE HCL 5 MG PO TABS: 5.0000 mg | ORAL_TABLET | ORAL | Status: DC | PRN

## 2024-02-10 MED ORDER — INSULIN ASPART 100 UNIT/ML IJ SOLN
0.0000 [IU] | Freq: Three times a day (TID) | INTRAMUSCULAR | Status: DC
  Administered 2024-02-11: 3 [IU] via SUBCUTANEOUS
  Administered 2024-02-11: 2 [IU] via SUBCUTANEOUS
  Filled 2024-02-10: qty 2
  Filled 2024-02-10: qty 3

## 2024-02-10 MED ORDER — ISOSORBIDE MONONITRATE ER 30 MG PO TB24
30.0000 mg | ORAL_TABLET | Freq: Every day | ORAL | Status: DC
  Administered 2024-02-11: 30 mg via ORAL
  Filled 2024-02-10: qty 1

## 2024-02-10 MED ORDER — NITROGLYCERIN 0.4 MG SL SUBL: 0.4000 mg | SUBLINGUAL_TABLET | SUBLINGUAL | Status: DC | PRN

## 2024-02-10 MED ORDER — SULFASALAZINE 500 MG PO TABS
1000.0000 mg | ORAL_TABLET | Freq: Three times a day (TID) | ORAL | Status: DC
  Administered 2024-02-10 – 2024-02-11 (×2): 1000 mg via ORAL
  Filled 2024-02-10 (×4): qty 2

## 2024-02-10 MED ORDER — NITROGLYCERIN 0.4 MG SL SUBL: 0.4000 mg | SUBLINGUAL_TABLET | SUBLINGUAL | 0 refills | Status: AC | PRN

## 2024-02-10 MED ORDER — ONDANSETRON HCL 4 MG PO TABS: 4.0000 mg | ORAL_TABLET | Freq: Four times a day (QID) | ORAL | Status: DC | PRN

## 2024-02-10 MED ORDER — METOPROLOL TARTRATE 5 MG/5ML IV SOLN: 5.0000 mg | Freq: Four times a day (QID) | INTRAVENOUS | Status: DC | PRN

## 2024-02-10 MED ORDER — ASPIRIN 81 MG PO TBEC: 81.0000 mg | DELAYED_RELEASE_TABLET | Freq: Every day | ORAL | Status: AC

## 2024-02-10 NOTE — Progress Notes (Signed)
 Cardiology Office Note:    Date:  02/10/2024  NAME:  Bruce Sanchez    MRN: [id number] DOB:  14-Jan-1956   PCP:  Joshua Debby CROME, MD  Primary Cardiologist:  Lynwood Schilling, MD Electrophysiologist:  None   Chief Complaint  Patient presents with   Follow-up    DOD Visit - Severe coronary calcification and CAD    Shortness of Breath    History of Present Illness:    Bruce Sanchez is a 68 y.o. Caucasian male whose past medical history and cardiovascular risk factors includes: Dyslipidemia, aortic atherosclerosis, severe coronary artery calcification, hypertension, hyperlipidemia.   Patient being followed by my partner Dr. Schilling for the cardiovascular comorbidities mentioned above.  He recently followed up with PCP and had a coronary CTA scheduled for workup of dyspnea on exertion.  His coronary CTA illustrated severe CAC with a total calcium  score of 5585 placing him at the 99th percentile, multivessel CAD suggestive of possible at least moderate severity (accuracy limited due to blooming artifact), the coronary CTA did not meet the quality metrics to be processed by [facility] and therefore FFR analysis could not be pursued for the entire coronary vasculature.  His aortic valve score  suggestive of severe aortic stenosis.  Patient comes in today for a DOD visit to be evaluated sooner than his originally scheduled appointment.  He is accompanied by his wife.   He has had exertional shortness of breath and a left-sided chest heaviness for the past couple of months. 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 The heaviness is about 3/10, does not radiate, and improves with slowing down and controlled breathing.  He has not had severe chest pain but did note exertional chest discomfort earlier this week while walking his dog.  In addition, patient states that he has a cardiac murmur which has been followed with serial echocardiograms.     Current Medications: Active Medications[1]   Allergies:    Metformin  and related, Trulicity [dulaglutide], Synjardy  [empagliflozin -metformin  hcl], and Olmesartan    Past Medical History: Past Medical History:  Diagnosis Date   Arthritis    Cataract    Colitis    Diabetes mellitus without complication (HCC)    Heart murmur    Hyperlipidemia    Hypertension    Thyroid  disease    Ulcerative colitis (HCC)     Past Surgical History: Past Surgical History:  Procedure Laterality Date   HERNIA REPAIR     WISDOM TOOTH EXTRACTION      Social History: Social History[2]  Family History: Family History  Problem Relation Age of Onset   Diabetes Mother    Arthritis Mother    Heart attack Mother    Heart disease Father    Heart attack Father    Diabetes Brother    Stomach cancer Neg Hx    Colon cancer Neg Hx    Esophageal cancer Neg Hx     ROS:   Review of Systems  Cardiovascular:  Positive for chest pain and dyspnea on exertion. Negative for claudication, irregular heartbeat, leg swelling, near-syncope, orthopnea, palpitations, paroxysmal nocturnal dyspnea and syncope.  Respiratory:  Negative for shortness of breath.   Hematologic/Lymphatic: Negative for bleeding problem.    Studies Reviewed:   EKG: EKG Interpretation Date/Time:  Friday February 10 2024 15:35:51 EST Ventricular Rate:  72 PR Interval:  172 QRS Duration:  84 QT Interval:  386 QTC Calculation: 422 R Axis:   6  Text  Interpretation: Normal sinus rhythm Septal infarct (cited on or before 10-Feb-2024) When compared with ECG of 12-Oct-2022 14:27, No significant change was found Confirmed by Michele Richardson [phone]) on 02/10/2024 3:40:13 PM  Echocardiogram: 05/2021  1. Left ventricular ejection fraction, by estimation, is 60 to 65%. The  left ventricle has normal function. The left ventricle has no regional  wall motion abnormalities. Left ventricular diastolic parameters were  normal.   2. Right  ventricular systolic function is normal. The right ventricular  size is normal.   3. Left atrial size was moderately dilated.   4. The mitral valve is abnormal. Trivial mitral valve regurgitation. No  evidence of mitral stenosis. Moderate mitral annular calcification.   5. The aortic valve is tricuspid. Aortic valve regurgitation is not  visualized. Mild to moderate aortic valve stenosis.   6. Aortic dilatation noted. There is mild dilatation of the aortic root,  measuring 40 mm.   7. The inferior vena cava is normal in size with greater than 50%  respiratory variability, suggesting right atrial pressure of 3 mmHg.    Coronary CTA: 02/08/2024 1. Coronary calcium  score of 5,585. This was 99th percentile for age and sex matched control.   2.  Normal coronary origins with right dominance.   3.  CAD-RADS 3 Moderate Coronary artery disease.   4. Mild (25-49%) stenosis due to calcified plaque at distal left main.   5. Moderate stenosis (50-69%) due to calcified plaque at mid LAD just distal to D1. Diffuse disease with tandem lesions in the mid to distal LAD due to mixed plaque likely moderate plaque burden (50-69%). Accuray is limited due to dense calcification causing blooming artifact and smaller vessel caliber distally.   6.  Moderate stenosis (50-69%) due to soft plaque at mid Ramus.   7. Mild (25-49%) diffuse disease predominantly calcified plaque at ostium, proximal, and mid LCx.   8. Moderate stenosis (50-69%) at proximal RCA due to calcified plaque.   9. Aortic valve calcium  score 2,772 suggestive of severe aortic stenosis, correlate with echocardiography.   10. Aortic atherosclerosis.   11. Mild dilatation of main pulmonary artery, 31 mm, may indicate increased pulmonary pressures.   12. Patent foramen ovale.   13. Due to study limitations CT FFR data is not available at this time. We resubmitted the dataset to [facility] but it was not processed.   Labs:    Latest Ref  Rng & Units 02/10/2024    5:04 PM 01/11/2024    3:54 PM 04/13/2023    4:10 PM  CBC  WBC 4.0 - 10.5 K/uL 7.5  4.2  5.9   Hemoglobin 13.0 - 17.0 g/dL 84.6  84.8  84.1   Hematocrit 39.0 - 52.0 % 44.9  45.4  47.9   Platelets 150 - 400 K/uL 208  187.0  184.0        Latest Ref Rng & Units 02/10/2024    5:04 PM 01/11/2024    3:54 PM 08/03/2023    3:53 PM  BMP  Glucose 70 - 99 mg/dL 737  818  757   BUN 8 - 23 mg/dL 14  13  13    Creatinine 0.61 - 1.24 mg/dL 9.43  9.38  9.35   Sodium 135 - 145 mmol/L 137  140  137   Potassium 3.5 - 5.1 mmol/L 3.7  4.1  3.7   Chloride 98 - 111 mmol/L 98  103  99   CO2 22 - 32 mmol/L 30  31  32  Calcium  8.9 - 10.3 mg/dL 8.8  9.4  9.2       Latest Ref Rng & Units 02/10/2024    5:04 PM 01/11/2024    3:54 PM 08/03/2023    3:53 PM  CMP  Glucose 70 - 99 mg/dL 737  818  757   BUN 8 - 23 mg/dL 14  13  13    Creatinine 0.61 - 1.24 mg/dL 9.43  9.38  9.35   Sodium 135 - 145 mmol/L 137  140  137   Potassium 3.5 - 5.1 mmol/L 3.7  4.1  3.7   Chloride 98 - 111 mmol/L 98  103  99   CO2 22 - 32 mmol/L 30  31  32   Calcium  8.9 - 10.3 mg/dL 8.8  9.4  9.2   Total Protein 6.0 - 8.3 g/dL   6.7   Total Bilirubin 0.2 - 1.2 mg/dL   0.7   Alkaline Phos 39 - 117 U/L   72   AST 0 - 37 U/L   21   ALT 0 - 53 U/L   23     Lab Results  Component Value Date   CHOL 115 08/03/2023   HDL 39.80 08/03/2023   LDLCALC 42 08/03/2023   LDLDIRECT 126.0 08/18/2022   TRIG 170.0 (H) 08/03/2023   CHOLHDL 3 08/03/2023   No results for input(s): LIPOA in the last 8760 hours. No components found for: NTPROBNP Recent Labs    01/11/24 1554  PROBNP 19.0   Recent Labs    04/13/23 1610 08/03/23 1553 01/11/24 1554  TSH 1.57 1.15 1.16    Physical Exam:    Today's Vitals   02/10/24 1510  BP: (!) 138/50  Pulse: 76  Weight: 188 lb (85.3 kg)  Height: 5' 10 (1.778 m)   Body mass index is 26.98 kg/m. Wt Readings from Last 3 Encounters:  02/10/24 188 lb (85.3 kg)  02/10/24  188 lb (85.3 kg)  01/11/24 195 lb (88.5 kg)    Physical Exam  Constitutional: No distress.  hemodynamically stable  Neck: No JVD present.  Cardiovascular: Normal rate, regular rhythm, S1 normal and S2 normal. Exam reveals no gallop, no S3 and no S4.  Murmur heard. Harsh crescendo-decrescendo midsystolic murmur is present with a grade of 3/6 at the upper right sternal border. Pulmonary/Chest: Effort normal and breath sounds normal. No stridor. He has no wheezes. He has no rales.  Musculoskeletal:        General: No edema.     Cervical back: Neck supple.  Skin: Skin is warm.    Impression & Recommendation(s):  Impression:   ICD-10-CM   1. Anginal pain  I20.9     2. Dyspnea on exertion  R06.09     3. Coronary atherosclerosis due to severely calcified coronary lesion  I25.84     4. Nonrheumatic aortic valve stenosis  I35.0 EKG 12-Lead    5. Type 2 diabetes mellitus with other circulatory complication, without long-term current use of insulin  (HCC)  E11.59     6. Dyslipidemia  E78.5        Recommendation(s):  Anginal pain Dyspnea on exertion Has recently been experiencing shortness of breath with effort related activities. Followed up with PCP who ordered a coronary CTA. Results of the coronary CTA impressive for CAC score >5000 as well as aortic valve coronary calcium  score suggestive of severe aortic stenosis.  Patient is noted to have multivessel CAD.   The study was sent to [facility] for FFR  analysis but the full coronary tree did not meet the quality standards for further processing.  Reviewed the images of his coronary CTA with the patient and his wife at today's office visit Initially patient stated that he was having exertional chest pain when walking his dog earlier this week. However, towards the end the office visit patient was reluctant but did endorse having similar chest pain this morning. Patient states that he feels uncomfortable staying at home with a degree of  CAD and he would like to go to the hospital for further evaluation.  I understood his standpoint and agreed as well given the ongoing symptoms. EKG in the office does not show STEMI. But I am concerned that he may be having unstable angina in the setting of multivessel CAD and aortic valve disease. Start aspirin  81 mg p.o. daily. Patient will be given sublingual nitroglycerin  tablets to use on a as needed basis. We discussed ordering a left and right heart catheterization as outpatient just in case if he does not go to the hospital; however, they reassured me that they are going to [facility] ER after the appointment. I reached out to the DOD provider at the hospital -and informed them of the tentative plan of IV heparin for concerns for ACS if no contraindications, and left and right heart catheterization tentatively on Monday sooner if change in clinical trajectory. I also spoke to Dr. Patt ED provider to help coordinate care. I will have him follow-up with Dr. Lavona, his primary cardiologist in 2 weeks. Of note his current antianginal therapies include Toprol -XL 50 mg p.o. daily, Imdur 30 mg p.o. daily, and amlodipine  10 mg p.o. daily Patient and wife are very thankful for bringing him in sooner as opposed to his later appointment.  Coronary atherosclerosis due to severely calcified coronary lesion See above  Nonrheumatic aortic valve stenosis Will need to update echocardiogram  Type 2 diabetes mellitus with other circulatory complication, without long-term current use of insulin  (HCC) Currently on oral antiglycemic's Consider SGLT2 inhibitors and ARB Continue statin therapy Globin A1c 5.9 as of September 2025  Dyslipidemia Lipids are well-controlled with LDL at 42 mg/dL as of June 7974. Currently on Crestor  20 mg p.o. nightly  Orders Placed:  Orders Placed This Encounter  Procedures   EKG 12-Lead    Final Medication List:    Meds ordered this encounter  Medications   aspirin  EC  81 MG tablet    Sig: Take 1 tablet (81 mg total) by mouth daily. Swallow whole.   nitroGLYCERIN  (NITROSTAT ) 0.4 MG SL tablet    Sig: Place 1 tablet (0.4 mg total) under the tongue every 5 (five) minutes as needed for chest pain.    Dispense:  30 tablet    Refill:  0    There are no discontinued medications.  Current Medications[3]  Consent:   NA  Disposition:   2-week follow-up with primary cardiology.  Patient may be asked to follow-up sooner based on the results of the above-mentioned testing.  Medical decision making: High risk Symptoms of angina with recent coronary CTA suggestive of multivessel CAD Due to ongoing chest pain/exertional shortness of breath patient voices going to the ER - which is quite reasonable. Medications as discussed above Will update primary physician, primary cardiologist. I personally spoke to the ED physician Dr. Patt and conveyed my concerns as per the current workup I also reached out to the DOD cardiologist at the hospital and they are aware of his arrival. Plan of care  discussed with patient and wife.  Signed, Madonna Large, DO, [facility]  A Division of [facility] [address], [place], [place] [address]      [1]  Current Meds  Medication Sig   amLODipine  (NORVASC ) 10 MG tablet TAKE 1 TABLET BY MOUTH DAILY   aspirin  EC 81 MG tablet Take 1 tablet (81 mg total) by mouth daily. Swallow whole.   Blood Glucose Monitoring Suppl DEVI 1 each by Does not apply route in the morning, at noon, and at bedtime. May substitute to any manufacturer covered by patient's insurance.   dicyclomine (BENTYL) 10 MG capsule Take by mouth.   Glucose Blood (BLOOD GLUCOSE TEST STRIPS) STRP Use to check blood glucose up to 3 times daily. May substitute to any manufacturer covered by patient's insurance.   hydrALAZINE  (APRESOLINE ) 25 MG tablet Take 1 tablet (25 mg total) by mouth 3 (three) times daily.   hydrocortisone  (ANUSOL -HC) 2.5 %  rectal cream Place 1 Application rectally daily as needed for hemorrhoids or anal itching.   indapamide  (LOZOL ) 1.25 MG tablet Take 1 tablet (1.25 mg total) by mouth daily.   Insulin  Glargine (BASAGLAR  KWIKPEN) 100 UNIT/ML Inject 22 Units into the skin daily. Getting through PAP   Insulin  Pen Needle (DROPLET PEN NEEDLES) 32G X 6 MM MISC Use to inject insulin  once daily.   isosorbide mononitrate (IMDUR) 30 MG 24 hr tablet Take 1 tablet (30 mg total) by mouth daily.   Lancets Misc. MISC Use to check blood glucose up to 3 times daily. May substitute to any manufacturer covered by patient's insurance.   mesalamine  (CANASA ) 1000 MG suppository Place 1 suppository (1,000 mg total) rectally at bedtime.   metFORMIN  (GLUCOPHAGE -XR) 750 MG 24 hr tablet Take 1 tablet (750 mg total) by mouth 2 (two) times daily.   metoprolol  succinate (TOPROL -XL) 50 MG 24 hr tablet TAKE 1 TABLET BY MOUTH DAILY WITH OR IMMEDIATELY AFTER A MEAL   nitroGLYCERIN  (NITROSTAT ) 0.4 MG SL tablet Place 1 tablet (0.4 mg total) under the tongue every 5 (five) minutes as needed for chest pain.   ondansetron  (ZOFRAN ) 4 MG tablet Take 1 tablet (4 mg total) by mouth every 6 (six) hours as needed for nausea or vomiting.   pramoxine (PROCTOFOAM) 1 % foam Place 1 Application rectally 3 (three) times daily as needed for anal itching.   rosuvastatin  (CRESTOR ) 20 MG tablet Take 1 tablet (20 mg total) by mouth daily.   sulfaSALAzine  (AZULFIDINE ) 500 MG tablet Take 2 tablets (1,000 mg total) by mouth 3 (three) times daily.  [2]  Social History Tobacco Use   Smoking status: Never    Passive exposure: Never   Smokeless tobacco: Never  Vaping Use   Vaping status: Never Used  Substance Use Topics   Alcohol use: Yes    Alcohol/week: 7.0 standard drinks of alcohol    Types: 7 Glasses of wine per week    Comment: occassionally   Drug use: Never  [3] No current facility-administered medications for this visit.  Current Outpatient Medications:     amLODipine  (NORVASC ) 10 MG tablet, TAKE 1 TABLET BY MOUTH DAILY, Disp: 90 tablet, Rfl: 0   aspirin  EC 81 MG tablet, Take 1 tablet (81 mg total) by mouth daily. Swallow whole., Disp: , Rfl:    Blood Glucose Monitoring Suppl DEVI, 1 each by Does not apply route in the morning, at noon, and at bedtime. May substitute to any manufacturer covered by patient's insurance., Disp: 1  each, Rfl: 0   dicyclomine (BENTYL) 10 MG capsule, Take by mouth., Disp: , Rfl:    Glucose Blood (BLOOD GLUCOSE TEST STRIPS) STRP, Use to check blood glucose up to 3 times daily. May substitute to any manufacturer covered by patient's insurance., Disp: 100 strip, Rfl: 11   hydrALAZINE  (APRESOLINE ) 25 MG tablet, Take 1 tablet (25 mg total) by mouth 3 (three) times daily., Disp: 270 tablet, Rfl: 0   hydrocortisone  (ANUSOL -HC) 2.5 % rectal cream, Place 1 Application rectally daily as needed for hemorrhoids or anal itching., Disp: 30 g, Rfl: 0   indapamide  (LOZOL ) 1.25 MG tablet, Take 1 tablet (1.25 mg total) by mouth daily., Disp: 90 tablet, Rfl: 0   Insulin  Glargine (BASAGLAR  KWIKPEN) 100 UNIT/ML, Inject 22 Units into the skin daily. Getting through PAP, Disp: , Rfl:    Insulin  Pen Needle (DROPLET PEN NEEDLES) 32G X 6 MM MISC, Use to inject insulin  once daily., Disp: 100 each, Rfl: 0   isosorbide mononitrate (IMDUR) 30 MG 24 hr tablet, Take 1 tablet (30 mg total) by mouth daily., Disp: 30 tablet, Rfl: 0   Lancets Misc. MISC, Use to check blood glucose up to 3 times daily. May substitute to any manufacturer covered by patient's insurance., Disp: 100 each, Rfl: 0   mesalamine  (CANASA ) 1000 MG suppository, Place 1 suppository (1,000 mg total) rectally at bedtime., Disp: 30 suppository, Rfl: 12   metFORMIN  (GLUCOPHAGE -XR) 750 MG 24 hr tablet, Take 1 tablet (750 mg total) by mouth 2 (two) times daily., Disp: 180 tablet, Rfl: 1   metoprolol  succinate (TOPROL -XL) 50 MG 24 hr tablet, TAKE 1 TABLET BY MOUTH DAILY WITH OR IMMEDIATELY AFTER A  MEAL, Disp: 90 tablet, Rfl: 0   nitroGLYCERIN  (NITROSTAT ) 0.4 MG SL tablet, Place 1 tablet (0.4 mg total) under the tongue every 5 (five) minutes as needed for chest pain., Disp: 30 tablet, Rfl: 0   ondansetron  (ZOFRAN ) 4 MG tablet, Take 1 tablet (4 mg total) by mouth every 6 (six) hours as needed for nausea or vomiting., Disp: 30 tablet, Rfl: 0   pramoxine (PROCTOFOAM) 1 % foam, Place 1 Application rectally 3 (three) times daily as needed for anal itching., Disp: 15 g, Rfl: 2   rosuvastatin  (CRESTOR ) 20 MG tablet, Take 1 tablet (20 mg total) by mouth daily., Disp: 90 tablet, Rfl: 1   sulfaSALAzine  (AZULFIDINE ) 500 MG tablet, Take 2 tablets (1,000 mg total) by mouth 3 (three) times daily., Disp: 180 tablet, Rfl: 3  Facility-Administered Medications Ordered in Other Visits:    heparin ADULT infusion 100 units/mL (25000 units/250mL), 1,100 Units/hr, Intravenous, Continuous, Patt Alm Macho, MD   heparin bolus via infusion 4,000 Units, 4,000 Units, Intravenous, Once, Patt Alm Macho, MD

## 2024-02-10 NOTE — Consult Note (Signed)
 Cardiology Consultation   Patient ID: Bruce Sanchez MRN: [id number]; DOB: [birth year]  Admit date: 02/10/2024 Date of Consult: 02/10/2024  PCP:  Bruce Debby CROME, MD   [facility] Providers Cardiologist:  Bruce Schilling, MD         History of Present Illness:   Bruce Sanchez is a 68 year old male with hypertension, hyperlipidemia, type 2 diabetes mellitus, ulcerative colitis, aortic stenosis, coronary artery disease  Patient usually sees Dr. Schilling outpatient.  Today, he was seen by Bruce Sanchez for an urgent visit after symptoms of chest pain shortness of breath.  Talking to the patient and wife more, it appears that he has been symptomatic with exertional chest pain and shortness of breath for last several weeks.  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  His physical activity outside of work includes walks with his dog.  This morning, he had chest pain and shortness of breath while walking with the dog, relieved with rest.  He is asymptomatic at rest at this time.  Separately, he has been feeling lightheaded with physical exertion.  He is on multiple blood pressure medications.  His diabetes is uncontrolled.  While A1c has improved, his daily blood sugars have been ranging in 200s and 300s.   His ulcerative colitis is stable on maintenance medication regimen.   Past Medical History:  Diagnosis Date   Arthritis    Cataract    Colitis    Diabetes mellitus without complication (HCC)    Heart murmur    Hyperlipidemia    Hypertension    Thyroid  disease    Ulcerative colitis (HCC)     Past Surgical History:  Procedure Laterality Date   HERNIA REPAIR     WISDOM TOOTH EXTRACTION       Home Medications:  Prior to Admission medications  Medication Sig Start Date End Date Taking? Authorizing Provider  amLODipine  (NORVASC ) 10 MG tablet TAKE 1 TABLET BY MOUTH DAILY 02/06/24   Bruce Schilling, MD  aspirin  EC 81 MG tablet Take 1  tablet (81 mg total) by mouth daily. Swallow whole. 02/10/24   Sanchez, [name], DO  Blood Glucose Monitoring Suppl DEVI 1 each by Does not apply route in the morning, at noon, and at bedtime. May substitute to any manufacturer covered by patient's insurance. 07/11/23   Bruce Debby CROME, MD  dicyclomine (BENTYL) 10 MG capsule Take by mouth. 02/11/21 02/10/24  [provider]  Glucose Blood (BLOOD GLUCOSE TEST STRIPS) STRP Use to check blood glucose up to 3 times daily. May substitute to any manufacturer covered by patient's insurance. 07/11/23   Bruce Debby CROME, MD  hydrALAZINE  (APRESOLINE ) 25 MG tablet Take 1 tablet (25 mg total) by mouth 3 (three) times daily. 01/12/24   Bruce Debby CROME, MD  hydrocortisone  (ANUSOL -HC) 2.5 % rectal cream Place 1 Application rectally daily as needed for hemorrhoids or anal itching. 10/05/23   Sanchez, Bruce V, DO  indapamide  (LOZOL ) 1.25 MG tablet Take 1 tablet (1.25 mg total) by mouth daily. 01/12/24   Bruce Debby CROME, MD  Insulin  Glargine (BASAGLAR  KWIKPEN) 100 UNIT/ML Inject 22 Units into the skin daily. Getting through PAP 02/06/24   Bruce Debby CROME, MD  Insulin  Pen Needle (DROPLET PEN NEEDLES) 32G X 6 MM MISC Use to inject insulin  once daily. 02/01/24   Bruce Debby CROME, MD  isosorbide mononitrate (IMDUR) 30 MG 24 hr tablet Take 1 tablet (30 mg total) by mouth daily. 02/08/24  Bruce Debby CROME, MD  Lancets Misc. MISC Use to check blood glucose up to 3 times daily. May substitute to any manufacturer covered by patient's insurance. 07/11/23   Bruce Debby CROME, MD  mesalamine  (CANASA ) 1000 MG suppository Place 1 suppository (1,000 mg total) rectally at bedtime. 11/09/23   Sanchez, Bruce V, DO  metFORMIN  (GLUCOPHAGE -XR) 750 MG 24 hr tablet Take 1 tablet (750 mg total) by mouth 2 (two) times daily. 02/01/24   Bruce Debby CROME, MD  metoprolol  succinate (TOPROL -XL) 50 MG 24 hr tablet TAKE 1 TABLET BY MOUTH DAILY WITH OR IMMEDIATELY AFTER A MEAL 01/09/24   Bruce Debby CROME, MD   nitroGLYCERIN  (NITROSTAT ) 0.4 MG SL tablet Place 1 tablet (0.4 mg total) under the tongue every 5 (five) minutes as needed for chest pain. 02/10/24 05/10/24  Sanchez, [name], DO  ondansetron  (ZOFRAN ) 4 MG tablet Take 1 tablet (4 mg total) by mouth every 6 (six) hours as needed for nausea or vomiting. 10/05/23   Sanchez, Bruce V, DO  pramoxine (PROCTOFOAM) 1 % foam Place 1 Application rectally 3 (three) times daily as needed for anal itching. 08/03/23   Bruce Debby CROME, MD  rosuvastatin  (CRESTOR ) 20 MG tablet Take 1 tablet (20 mg total) by mouth daily. 01/12/24   Bruce Debby CROME, MD  sulfaSALAzine  (AZULFIDINE ) 500 MG tablet Take 2 tablets (1,000 mg total) by mouth 3 (three) times daily. 10/12/23   Bruce Debby CROME, MD    Inpatient Medications: Scheduled Meds:  Continuous Infusions:  PRN Meds:   Allergies:   Allergies[1]  Social History:   Social History   Socioeconomic History   Marital status: Married    Spouse name: Not on file   Number of children: Not on file   Years of education: Not on file   Highest education level: Not on file  Occupational History   Occupation: chef  Tobacco Use   Smoking status: Never    Passive exposure: Never   Smokeless tobacco: Never  Vaping Use   Vaping status: Never Used  Substance and Sexual Activity   Alcohol use: Yes    Alcohol/week: 7.0 standard drinks of alcohol    Types: 7 Glasses of wine per week    Comment: occassionally   Drug use: Never   Sexual activity: Yes    Partners: Female  Other Topics Concern   Not on file  Social History Narrative   Married   Social Drivers of Health   Tobacco Use: Low Risk (02/10/2024)   Patient History    Smoking Tobacco Use: Never    Smokeless Tobacco Use: Never    Passive Exposure: Never  Financial Resource Strain: Low Risk (06/28/2023)   Overall Financial Resource Strain (CARDIA)    Difficulty of Paying Living Expenses: Not hard at all  Food Insecurity: No Food Insecurity (06/28/2023)   Hunger  Vital Sign    Worried About Running Out of Food in the Last Year: Never true    Ran Out of Food in the Last Year: Never true  Transportation Needs: No Transportation Needs (06/28/2023)   PRAPARE - Administrator, Civil Service (Medical): No    Lack of Transportation (Non-Medical): No  Physical Activity: Sufficiently Active (06/28/2023)   Exercise Vital Sign    Days of Exercise per Week: 7 days    Minutes of Exercise per Session: 60 min  Stress: No Stress Concern Present (06/28/2023)   [employer] of Occupational Health - Occupational Stress Questionnaire    Feeling of  Stress : Not at all  Social Connections: Moderately Integrated (06/28/2023)   Social Connection and Isolation Panel    Frequency of Communication with Friends and Family: More than three times a week    Frequency of Social Gatherings with Friends and Family: More than three times a week    Attends Religious Services: Never    Database Administrator or Organizations: Yes    Attends Banker Meetings: 1 to 4 times per year    Marital Status: Married  Catering Manager Violence: Not At Risk (06/28/2023)   Humiliation, Afraid, Rape, and Kick questionnaire    Fear of Current or Ex-Partner: No    Emotionally Abused: No    Physically Abused: No    Sexually Abused: No  Depression (PHQ2-9): Low Risk (01/11/2024)   Depression (PHQ2-9)    PHQ-2 Score: 0  Alcohol Screen: Low Risk (06/28/2023)   Alcohol Screen    Last Alcohol Screening Score (AUDIT): 0  Housing: Unknown (06/28/2023)   Housing Stability Vital Sign    Unable to Pay for Housing in the Last Year: No    Number of Times Moved in the Last Year: Not on file    Homeless in the Last Year: No  Utilities: Not At Risk (06/28/2023)   AHC Utilities    Threatened with loss of utilities: No  Health Literacy: Adequate Health Literacy (06/28/2023)   B1300 Health Literacy    Frequency of need for help with medical instructions: Never    Family History:     Family History  Problem Relation Age of Onset   Diabetes Mother    Arthritis Mother    Heart attack Mother    Heart disease Father    Heart attack Father    Diabetes Brother    Stomach cancer Neg Hx    Colon cancer Neg Hx    Esophageal cancer Neg Hx      ROS:  Please see the history of present illness.  All other ROS reviewed and negative.     Physical Exam/Data:   Vitals:   02/10/24 1657 02/10/24 1730 02/10/24 1731 02/10/24 1755  BP:  132/72  132/72  Pulse:  73  75  Resp:  12  14  Temp:    98.3 F (36.8 C)  TempSrc:    Oral  SpO2:  100% 98% 100%  Weight: 85.3 kg     Height: 5' 10 (1.778 m)      No intake or output data in the 24 hours ending 02/10/24 1906    02/10/2024    4:57 PM 02/10/2024    3:10 PM 01/11/2024    3:11 PM  Last 3 Weights  Weight (lbs) 188 lb 188 lb 195 lb  Weight (kg) 85.276 kg 85.276 kg 88.451 kg     Body mass index is 26.98 kg/m.  Physical Exam Vitals and nursing note reviewed.  Constitutional:      General: He is not in acute distress. Neck:     Vascular: No JVD.  Cardiovascular:     Rate and Rhythm: Normal rate and regular rhythm.     Heart sounds: Murmur heard.     Harsh midsystolic murmur is present with a grade of 3/6 at the upper right sternal border radiating to the neck.  Pulmonary:     Effort: Pulmonary effort is normal.     Breath sounds: Normal breath sounds. No wheezing or rales.  Musculoskeletal:     Right lower leg: No edema.  Left lower leg: No edema.      EKG:  The EKG was personally reviewed and demonstrates:   EKG 02/10/2024: Sinus rhythm 86 bpm Cannot exclude old anteroseptal infarct No acute ischemic changes  Telemetry:  Telemetry was personally reviewed and demonstrates:   No significant arrhythmia  Relevant CV Studies: Coronary CT angiogram 02/06/2024: 1. Coronary calcium  score of 5,585. This was 99th percentile for age and sex matched control.  2.  Normal coronary origins with right  dominance.  3.  CAD-RADS 3 Moderate Coronary artery disease.  4. Mild (25-49%) stenosis due to calcified plaque at distal left main.  5. Moderate stenosis (50-69%) due to calcified plaque at mid LAD just distal to D1. Diffuse disease with tandem lesions in the mid to distal LAD due to mixed plaque likely moderate plaque burden (50-69%). Accuray is limited due to dense calcification causing blooming artifact and smaller vessel caliber distally.  6.  Moderate stenosis (50-69%) due to soft plaque at mid Ramus.  7. Mild (25-49%) diffuse disease predominantly calcified plaque at ostium, proximal, and mid LCx.  8. Moderate stenosis (50-69%) at proximal RCA due to calcified plaque.  9. Aortic valve calcium  score 2,772 suggestive of severe aortic stenosis, correlate with echocardiography.  10. Aortic atherosclerosis.  11. Mild dilatation of main pulmonary artery, 31 mm, may indicate increased pulmonary pressures.  12. Patent foramen ovale.  13. Due to study limitations CT FFR data is not available at this time. We resubmitted the dataset to [facility] but it was not processed.   RECOMMENDATION: Consider symptom-guided anti-ischemic pharmacotherapy as well as risk factor modification per guideline directed care.  Cardiology consultation recommended.  Consider either invasive angiography or different imaging modality to evaluate for ischemia, clinical correlation is required.    Echocardiogram 05/2021:  1. Left ventricular ejection fraction, by estimation, is 60 to 65%. The  left ventricle has normal function. The left ventricle has no regional  wall motion abnormalities. Left ventricular diastolic parameters were  normal.   2. Right ventricular systolic function is normal. The right ventricular  size is normal.   3. Left atrial size was moderately dilated.   4. The mitral valve is abnormal. Trivial mitral valve regurgitation. No  evidence of mitral stenosis. Moderate mitral annular  calcification.   5. The aortic valve is tricuspid. Aortic valve regurgitation is not  visualized. Mild to moderate aortic valve stenosis.   6. Aortic dilatation noted. There is mild dilatation of the aortic root,  measuring 40 mm.   7. The inferior vena cava is normal in size with greater than 50%  respiratory variability, suggesting right atrial pressure of 3 mmHg.    Laboratory Data:  High Sensitivity Troponin:   Recent Labs  Lab 02/10/24 1704  TROPONINIHS 4     Chemistry Recent Labs  Lab 02/10/24 1704  NA 137  K 3.7  CL 98  CO2 30  GLUCOSE 262*  BUN 14  CREATININE 0.56*  CALCIUM  8.8*  GFRNONAA >60  ANIONGAP 9     Hematology Recent Labs  Lab 02/10/24 1704  WBC 7.5  RBC 4.96  HGB 15.3  HCT 44.9  MCV 90.5  MCH 30.8  MCHC 34.1  RDW 12.9  PLT 208   Thyroid  No results for input(s): TSH, FREET4 in the last 168 hours.  BNPNo results for input(s): BNP, PROBNP in the last 168 hours.  DDimer No results for input(s): DDIMER in the last 168 hours.   Radiology/Studies:  DG Chest 2 View Result Date:  02/10/2024 CLINICAL DATA:  Shortness of breath EXAM: CHEST - 2 VIEW COMPARISON:  CT 02/15/2023 FINDINGS: No acute airspace disease or pleural effusion. Minimal atelectasis at the lingula. Aortic atherosclerosis. Diffuse ankylosis of the spine. IMPRESSION: No active cardiopulmonary disease. Minimal atelectasis at the lingula. Electronically Signed   By: Luke Bun M.D.   On: 02/10/2024 19:02     Assessment and Plan:   68 year old male with hypertension, hyperlipidemia, type 2 diabetes mellitus, ulcerative colitis, aortic stenosis, coronary artery disease  Coronary artery disease, aortic stenosis: Severe exertional angina, dyspnea, exertional dizziness. At least moderate coronary artery disease, likely severe aortic stenosis. Patient is asymptomatic at rest, but symptomatic with minimal exertion. Resting EKG shows no ischemia.  First troponin is normal.   BNP is pending. I had a lengthy discussion with the patient and his wife regarding his presentation and workup.  His symptoms are likely chronic, progressively worsening.  I do not see any acute coronary syndrome, or unstable angina presentation.  He is on several blood pressure medications with controlled blood pressure, but exertional dizziness likely related to severe aortic stenosis.  I suggested monitoring him for 24 hours under observation, consider IV diuresis if BNP is elevated, obtain echocardiogram in the morning.  In the meantime, I will defer management of brittle diabetes to medicine team.  If all above objectives are met, we could potentially discharge him tomorrow morning and arrange outpatient left and right heart catheterization, and referral to TAVR clinic hopefully in next couple of weeks. Unfortunately, I suspect he will continue to stay symptomatic with anginal symptoms until after TAVR is completed.  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  Since the etiology is severe aortic stenosis, I think will continue to stay symptomatic until after TAVR.  I have encouraged him to discuss with his employer regarding absence of work until end of March, with potential return to work much before that depending on when he undergoes TAVR.  Please consider adding this to patient's discharge summary or as a work excuse note on discharge.   Hypertension: Consider reducing amlodipine  from 10 mg daily to 5 mg daily to avoid exertional dizziness, and potential exertional hypotension.  Type 2 diabetes mellitus: Significantly improved A1c November, but significant variation in his blood sugars usually ranging in 200s and 300s.  He also has albuminuria with ongoing outpatient workup. Consider adjustment of his diabetes medications, as necessary.   Risk Assessment/Risk Scores:    [place]   Heart Association (NYHA) Functional Class NYHA Class II       For questions or updates, please contact [facility] Please consult [email or web address] for contact info under    Signed, Newman JINNY Lawrence, MD  02/10/2024 7:06 PM     [1]  Allergies Allergen Reactions   Metformin  And Related Diarrhea   Trulicity [Dulaglutide] Nausea And Vomiting   Synjardy  [Empagliflozin -Metformin  Hcl] Diarrhea and Nausea Only   Olmesartan  Rash

## 2024-02-10 NOTE — Progress Notes (Signed)
 ANTICOAGULATION CONSULT NOTE  Pharmacy Consult for Heparin Indication: chest pain/ACS  Allergies[1]  Patient Measurements: Height: 5' 10 (177.8 cm) Weight: 85.3 kg (188 lb) IBW/kg (Calculated) : 73 Heparin Dosing Weight: 85 kg  Vital Signs: Temp: 98.3 F (36.8 C) (12/12 1755) Temp Source: Oral (12/12 1755) BP: 132/72 (12/12 1755) Pulse Rate: 75 (12/12 1755)  Labs: Recent Labs    02/10/24 1704  HGB 15.3  HCT 44.9  PLT 208    CrCl cannot be calculated (Patient's most recent lab result is older than the maximum 21 days allowed.).   Medical History: Past Medical History:  Diagnosis Date   Arthritis    Cataract    Colitis    Diabetes mellitus without complication (HCC)    Heart murmur    Hyperlipidemia    Hypertension    Thyroid  disease    Ulcerative colitis (HCC)     Medications:  (Not in a hospital admission)  Scheduled:  Infusions:  PRN:   Assessment: 68 yom presenting with SOB. Heparin per pharmacy consult placed for chest pain/ACS.  Patient is not on anticoagulation prior to arrival.  Hgb 15.3; plt 208  Goal of Therapy:  Heparin level 0.3-0.7 units/ml Monitor platelets by anticoagulation protocol: Yes   Plan:  Give IV heparin 4000 units bolus x 1 Start heparin infusion at 1100 units/hr Check anti-Xa level in 8 hours and daily while on heparin Continue to monitor H&H and platelets  Dorn Buttner, PharmD, BCPS 02/10/2024 6:07 PM ED Clinical Pharmacist -  [phone]       [1]  Allergies Allergen Reactions   Metformin  And Related Diarrhea   Trulicity [Dulaglutide] Nausea And Vomiting   Synjardy  [Empagliflozin -Metformin  Hcl] Diarrhea and Nausea Only   Olmesartan  Rash

## 2024-02-10 NOTE — Telephone Encounter (Signed)
-----   Message from [place], [place] sent at 02/09/2024  4:31 PM EST ----- Regarding: RE: Abnormal CCTA Bruce Sanchez,   Can you see if he can get a sooner appt made with any off our providers?   I read his ccta this week. He shouldn't have to wait until January to see someone.   He is slated to see Dr. Ivory in January either he can be seen sooner with [name], myself, or DOD this or next week.   Let me know the final disposition.   Dr. Michele.  ----- Message ----- From: Joshua Debby CROME, MD Sent: 02/09/2024   4:27 PM EST To: Madonna Michele, DO Subject: RE: Abnormal CCTA                              He has DOE I have asked him to see cardiology asap ----- Message ----- From: Michele Madonna, DO Sent: 02/09/2024   4:16 PM EST To: Debby CROME Joshua, MD Subject: Abnormal CCTA                                  Hi,   Read his CCTA - very high CAC score and AVA score. Cannot rule out true ischemia but has multivessel CAD.   I am not sure if he is symptomatic with true cardiac chest pain or atypical. If you would like I can see if we can arrange a sooner appt with [name] (pt has an appt to see him in Jan 2026) or me or DOD sooner.   Let me know your insight.   [name]

## 2024-02-10 NOTE — Telephone Encounter (Signed)
 Spoke with patient, Relayed Dr. Tyree note. Scheduled patient for same day DOD appointment with Dr. Michele on 02/10/24 at 3:20.

## 2024-02-10 NOTE — ED Triage Notes (Signed)
 Pt c.o intermittent sob for the past few months, pt seen at cardiology office today, had a Coronary CT done 12/8 that showed plaque build up. Intermittent chest pain for the past week, denies any at this time.

## 2024-02-10 NOTE — ED Provider Notes (Signed)
 [place] [facility]MENT AT [facility] Provider Note   CSN: [id number] Arrival date & time: 02/10/24  8367     Patient presents with: Shortness of Breath   Bruce Sanchez is a 68 y.o. male history of hypertension, diabetes here presenting with shortness of breath with exertion.  Patient has been having intermittent shortness of breath for several months now.  Patient had an outpatient CT coronary done 2 days ago which showed that high calcium  score.  Patient had referral to cardiology and saw Dr. Michele.  Patient states that today he was walking his dog and had some shortness of breath with exertion.  Patient denies any chest pain.  Patient was scheduled for outpatient cath but Dr. Michele felt that patient needs to be admitted to get an inpatient cath.  Patient never had a cardiac catheterization done before.   The history is provided by the patient.       Prior to Admission medications  Medication Sig Start Date End Date Taking? Authorizing Provider  amLODipine  (NORVASC ) 10 MG tablet TAKE 1 TABLET BY MOUTH DAILY 02/06/24   Lynwood Schilling, MD  aspirin  EC 81 MG tablet Take 1 tablet (81 mg total) by mouth daily. Swallow whole. 02/10/24   Tolia, [name], DO  Blood Glucose Monitoring Suppl DEVI 1 each by Does not apply route in the morning, at noon, and at bedtime. May substitute to any manufacturer covered by patient's insurance. 07/11/23   Joshua Debby CROME, MD  dicyclomine (BENTYL) 10 MG capsule Take by mouth. 02/11/21 02/10/24  [provider]  Glucose Blood (BLOOD GLUCOSE TEST STRIPS) STRP Use to check blood glucose up to 3 times daily. May substitute to any manufacturer covered by patient's insurance. 07/11/23   Joshua Debby CROME, MD  hydrALAZINE  (APRESOLINE ) 25 MG tablet Take 1 tablet (25 mg total) by mouth 3 (three) times daily. 01/12/24   Joshua Debby CROME, MD  hydrocortisone  (ANUSOL -HC) 2.5 % rectal cream Place 1 Application rectally daily as needed for hemorrhoids or  anal itching. 10/05/23   Cirigliano, Vito V, DO  indapamide  (LOZOL ) 1.25 MG tablet Take 1 tablet (1.25 mg total) by mouth daily. 01/12/24   Joshua Debby CROME, MD  Insulin  Glargine (BASAGLAR  KWIKPEN) 100 UNIT/ML Inject 22 Units into the skin daily. Getting through PAP 02/06/24   Joshua Debby CROME, MD  Insulin  Pen Needle (DROPLET PEN NEEDLES) 32G X 6 MM MISC Use to inject insulin  once daily. 02/01/24   Joshua Debby CROME, MD  isosorbide mononitrate (IMDUR) 30 MG 24 hr tablet Take 1 tablet (30 mg total) by mouth daily. 02/08/24   Joshua Debby CROME, MD  Lancets Misc. MISC Use to check blood glucose up to 3 times daily. May substitute to any manufacturer covered by patient's insurance. 07/11/23   Joshua Debby CROME, MD  mesalamine  (CANASA ) 1000 MG suppository Place 1 suppository (1,000 mg total) rectally at bedtime. 11/09/23   Cirigliano, Vito V, DO  metFORMIN  (GLUCOPHAGE -XR) 750 MG 24 hr tablet Take 1 tablet (750 mg total) by mouth 2 (two) times daily. 02/01/24   Joshua Debby CROME, MD  metoprolol  succinate (TOPROL -XL) 50 MG 24 hr tablet TAKE 1 TABLET BY MOUTH DAILY WITH OR IMMEDIATELY AFTER A MEAL 01/09/24   Joshua Debby CROME, MD  nitroGLYCERIN  (NITROSTAT ) 0.4 MG SL tablet Place 1 tablet (0.4 mg total) under the tongue every 5 (five) minutes as needed for chest pain. 02/10/24 05/10/24  Tolia, [name], DO  ondansetron  (ZOFRAN ) 4 MG tablet Take 1 tablet (4 mg  total) by mouth every 6 (six) hours as needed for nausea or vomiting. 10/05/23   Cirigliano, Vito V, DO  pramoxine (PROCTOFOAM) 1 % foam Place 1 Application rectally 3 (three) times daily as needed for anal itching. 08/03/23   Joshua Debby CROME, MD  rosuvastatin  (CRESTOR ) 20 MG tablet Take 1 tablet (20 mg total) by mouth daily. 01/12/24   Joshua Debby CROME, MD  sulfaSALAzine  (AZULFIDINE ) 500 MG tablet Take 2 tablets (1,000 mg total) by mouth 3 (three) times daily. 10/12/23   Joshua Debby CROME, MD    Allergies: Metformin  and related, Trulicity [dulaglutide], Synjardy   [empagliflozin -metformin  hcl], and Olmesartan     Review of Systems  Respiratory:  Positive for shortness of breath.   All other systems reviewed and are negative.   Updated Vital Signs BP 132/72 (BP Location: Right Arm)   Pulse 75   Temp 98.3 F (36.8 C) (Oral)   Resp 14   Ht 5' 10 (1.778 m)   Wt 85.3 kg   SpO2 100%   BMI 26.98 kg/m   Physical Exam Vitals and nursing note reviewed.  HENT:     Head: Normocephalic.     Mouth/Throat:     Mouth: Mucous membranes are moist.  Eyes:     Extraocular Movements: Extraocular movements intact.     Pupils: Pupils are equal, round, and reactive to light.  Cardiovascular:     Rate and Rhythm: Normal rate and regular rhythm.  Pulmonary:     Effort: Pulmonary effort is normal.     Breath sounds: Normal breath sounds.  Abdominal:     Palpations: Abdomen is soft.  Musculoskeletal:        General: Normal range of motion.     Cervical back: Normal range of motion and neck supple.  Skin:    General: Skin is warm.     Capillary Refill: Capillary refill takes less than 2 seconds.  Neurological:     General: No focal deficit present.     Mental Status: He is alert and oriented to person, place, and time.  Psychiatric:        Mood and Affect: Mood normal.        Behavior: Behavior normal.     (all labs ordered are listed, but only abnormal results are displayed) Labs Reviewed  BASIC METABOLIC PANEL WITH GFR - Abnormal; Notable for the following components:      Result Value   Glucose, Bld 262 (*)    Creatinine, Ser 0.56 (*)    Calcium  8.8 (*)    All other components within normal limits  CBC  BRAIN NATRIURETIC PEPTIDE  HEPARIN LEVEL (UNFRACTIONATED)  CBC  TROPONIN I (HIGH SENSITIVITY)    EKG: EKG Interpretation Date/Time:  Friday February 10 2024 16:47:02 EST Ventricular Rate:  86 PR Interval:  158 QRS Duration:  82 QT Interval:  358 QTC Calculation: 428 R Axis:   8  Text Interpretation: Normal sinus rhythm Anterior  infarct , age undetermined Abnormal ECG When compared with ECG of 10-Feb-2024 15:35, PREVIOUS ECG IS PRESENT Confirmed by Patt Alm DEL [phone]) on 02/10/2024 5:30:49 PM  Radiology: No results found.   Procedures   Medications Ordered in the ED  heparin bolus via infusion 4,000 Units (has no administration in time range)  heparin ADULT infusion 100 units/mL (25000 units/250mL) (has no administration in time range)  Medical Decision Making Ziggy Yniguez is a 68 y.o. male here presenting with shortness of breath with exertion.  Patient has a high calcium  score on CT coronary.  High suspicion for ACS.  Dr. Michele called me and recommend IV heparin and admission.  He states that cardiology will need to be consulted when the troponin comes back.   7:05 PM Patient's troponin is normal.  Dr. Elmira saw patient and since troponin is normal does not want to start on heparin.  He did hear a loud systolic murmur and recommend echo done inpatient.  He states that he will coordinate cath sometime next week.  Hospitalist to admit.  Problems Addressed: Chest pain due to CAD: acute illness or injury  Amount and/or Complexity of Data Reviewed Labs: ordered. Decision-making details documented in ED Course. Radiology: ordered and independent interpretation performed. Decision-making details documented in ED Course. ECG/medicine tests: ordered and independent interpretation performed. Decision-making details documented in ED Course.  Risk Prescription drug management.     Final diagnoses:  None    ED Discharge Orders     None          Patt Alm Macho, MD 02/10/24 NANCYANN

## 2024-02-10 NOTE — Patient Instructions (Signed)
 Medication Instructions:  START Aspirin  81 mg. Take one (1) tablet by mouth once daily.   START Nitroglycerin  0.4 mg Sublingual (under the tongue). At the onset of chest pain take one (1) tablet, place it under your tongue and allow it to dissolve completely. After five (5) minutes, if still experiencing chest pain, take a second dose. After five (5) minutes, if still experiencing chest pain, take a third dose AND call 911. *If you need a refill on your cardiac medications before your next appointment, please call your pharmacy*  Lab Work: None ordered If you have labs (blood work) drawn today and your tests are completely normal, you will receive your results only by: MyChart Message (if you have MyChart) OR A paper copy in the mail If you have any lab test that is abnormal or we need to change your treatment, we will call you to review the results.  Testing/Procedures: None ordered  Follow-Up: At [facility], you and your health needs are our priority.  As part of our continuing mission to provide you with exceptional heart care, our providers are all part of one team.  This team includes your primary Cardiologist (physician) and Advanced Practice Providers or APPs (Physician Assistants and Nurse Practitioners) who all work together to provide you with the care you need, when you need it.  Your next appointment:   2 week(s)  Provider:   Lynwood Schilling, MD    We recommend signing up for the patient portal called MyChart.  Sign up information is provided on this After Visit Summary.  MyChart is used to connect with patients for Virtual Visits (Telemedicine).  Patients are able to view lab/test results, encounter notes, upcoming appointments, etc.  Non-urgent messages can be sent to your provider as well.   To learn more about what you can do with MyChart, go to [email or web address].

## 2024-02-10 NOTE — H&P (Signed)
 History and Physical    Patient: Bruce Sanchez FMW:[id number] DOB: 1955-10-01 DOA: 02/10/2024 DOS: the patient was seen and examined on 02/10/2024 PCP: Joshua Debby CROME, MD  Patient coming from: Home  Chief Complaint:  Chief Complaint  Patient presents with   Shortness of Breath   HPI: Bruce Sanchez is a 68 y.o. male with medical history significant of Hypertension, Type II DM on Insulin  therapy, Hyperlipidemia, Ulcerative colitis, CAD as seen on Cardiac CT.Patient presented earlier today to cardiology with concerns of progressive shortness and chest pain.  Per patient, he was walking his dog earlier today and felt suddenly lightheaded.  He has been experiencing recurrent similar symptoms and hence his concern.  He also describes occasional left-sided chest discomfort, describes it as a feeling of pressure in his chest.  Symptoms usually occur with exertion.  He denied any associated diaphoresis or palpitations.  Denied any associated nausea or vomiting. He admits compliance with his medications but has not been taking his baby aspirin  as prescribed.  He also describes family history of coronary disease and father and mother when they were in the 43s.  He works as a investment banker, operational.   Review of Systems: As mentioned in the history of present illness. All other systems reviewed and are negative. Past Medical History:  Diagnosis Date   Arthritis    Cataract    Colitis    Diabetes mellitus without complication (HCC)    Heart murmur    Hyperlipidemia    Hypertension    Thyroid  disease    Ulcerative colitis (HCC)    Past Surgical History:  Procedure Laterality Date   HERNIA REPAIR     WISDOM TOOTH EXTRACTION     Social History:  reports that he has never smoked. He has never been exposed to tobacco smoke. He has never used smokeless tobacco. He reports current alcohol use of about 7.0 standard drinks of alcohol per week. He reports that he does not use drugs.  Allergies[1]  Family History   Problem Relation Age of Onset   Diabetes Mother    Arthritis Mother    Heart attack Mother    Heart disease Father    Heart attack Father    Diabetes Brother    Stomach cancer Neg Hx    Colon cancer Neg Hx    Esophageal cancer Neg Hx     Prior to Admission medications  Medication Sig Start Date End Date Taking? Authorizing Provider  amLODipine  (NORVASC ) 10 MG tablet TAKE 1 TABLET BY MOUTH DAILY 02/06/24  Yes Lynwood Schilling, MD  aspirin  EC 81 MG tablet Take 1 tablet (81 mg total) by mouth daily. Swallow whole. Patient taking differently: Take 81 mg by mouth daily as needed. Swallow whole. 02/10/24  Yes Tolia, [name], DO  hydrALAZINE  (APRESOLINE ) 25 MG tablet Take 1 tablet (25 mg total) by mouth 3 (three) times daily. 01/12/24  Yes Joshua Debby CROME, MD  hydrocortisone  (ANUSOL -HC) 2.5 % rectal cream Place 1 Application rectally daily as needed for hemorrhoids or anal itching. 10/05/23  Yes Cirigliano, Vito V, DO  indapamide  (LOZOL ) 1.25 MG tablet Take 1 tablet (1.25 mg total) by mouth daily. 01/12/24  Yes Joshua Debby CROME, MD  insulin  glargine, 2 Unit Dial , (TOUJEO  MAX SOLOSTAR) 300 UNIT/ML Solostar Pen Inject 20 Units into the skin every evening.   Yes [provider]  metFORMIN  (GLUCOPHAGE -XR) 750 MG 24 hr tablet Take 1 tablet (750 mg total) by mouth 2 (two) times daily. 02/01/24  Yes Joshua Debby CROME,  MD  metoprolol  succinate (TOPROL -XL) 50 MG 24 hr tablet TAKE 1 TABLET BY MOUTH DAILY WITH OR IMMEDIATELY AFTER A MEAL 01/09/24  Yes Joshua Debby CROME, MD  rosuvastatin  (CRESTOR ) 20 MG tablet Take 1 tablet (20 mg total) by mouth daily. 01/12/24  Yes Joshua Debby CROME, MD  sulfaSALAzine  (AZULFIDINE ) 500 MG tablet Take 2 tablets (1,000 mg total) by mouth 3 (three) times daily. 10/12/23  Yes Joshua Debby CROME, MD  Blood Glucose Monitoring Suppl DEVI 1 each by Does not apply route in the morning, at noon, and at bedtime. May substitute to any manufacturer covered by patient's insurance. 07/11/23    Joshua Debby CROME, MD  Glucose Blood (BLOOD GLUCOSE TEST STRIPS) STRP Use to check blood glucose up to 3 times daily. May substitute to any manufacturer covered by patient's insurance. 07/11/23   Joshua Debby CROME, MD  Insulin  Pen Needle (DROPLET PEN NEEDLES) 32G X 6 MM MISC Use to inject insulin  once daily. 02/01/24   Joshua Debby CROME, MD  isosorbide mononitrate (IMDUR) 30 MG 24 hr tablet Take 1 tablet (30 mg total) by mouth daily. 02/08/24   Joshua Debby CROME, MD  Lancets Misc. MISC Use to check blood glucose up to 3 times daily. May substitute to any manufacturer covered by patient's insurance. 07/11/23   Joshua Debby CROME, MD  mesalamine  (CANASA ) 1000 MG suppository Place 1 suppository (1,000 mg total) rectally at bedtime. Patient not taking: Reported on 02/10/2024 11/09/23   Cirigliano, Vito V, DO  nitroGLYCERIN  (NITROSTAT ) 0.4 MG SL tablet Place 1 tablet (0.4 mg total) under the tongue every 5 (five) minutes as needed for chest pain. 02/10/24 05/10/24  Tolia, [name], DO  ondansetron  (ZOFRAN ) 4 MG tablet Take 1 tablet (4 mg total) by mouth every 6 (six) hours as needed for nausea or vomiting. Patient not taking: Reported on 02/10/2024 10/05/23   Cirigliano, Vito V, DO  pramoxine (PROCTOFOAM) 1 % foam Place 1 Application rectally 3 (three) times daily as needed for anal itching. Patient not taking: Reported on 02/10/2024 08/03/23   Joshua Debby CROME, MD    Physical Exam: Vitals:   02/10/24 1657 02/10/24 1730 02/10/24 1731 02/10/24 1755  BP:  132/72  132/72  Pulse:  73  75  Resp:  12  14  Temp:    98.3 F (36.8 C)  TempSrc:    Oral  SpO2:  100% 98% 100%  Weight: 85.3 kg     Height: 5' 10 (1.778 m)      General: Patient is a pleasant and well looking gentleman without any acute discomfort. HEENT: Head is atraumatic, pupils equal round react light accommodation.  Some evidence of micrognatia Neck: Supple with no JVD Chest: Clinically diminished bilaterally without any added sounds Cardiovascular: Regular  S1-S2 with no murmur Abdomen: Soft nontender with no organomegaly Extremities without any pedal edema Skin: Negative for any new rash.  Data Reviewed: Sodium 137, potassium 3.7, chloride 98, bicarb 30, blood glucose 262, BUN 14, creatinine 0.56.  First set of troponin was 4, BNP 24.5 WBC 7.5, hemoglobin 15.3, hematocrit 44, platelet count 208, Chest x-ray: No active cardiopulmonary disease, minimal atelectasis at the lingula.  Assessment and Plan:  68 year old with significant risk factors for coronary artery disease that include hypertension, diabetes mellitus, and strong family history of coronary disease.  He presents on account of worsening chest pain and exertional dyspnea/dizziness.  Patient was recently worked up for coronary disease with coronary CT.  Incidental finding of calcium  score concerning for severe  aortic stenosis.  Patient is being admitted for further workup including echocardiogram.  Cardiology input appreciated.   Acute chest pain/shortness of breath: Symptoms concerning for possible angina like symptoms.  Recent coronary CT did show CAD RADS 3 moderate coronary artery disease.  Moderate stenosis (50 -69% ) was seen in the mid LAD, mid ramus and proximal RCA.  Patient was seen and evaluated in the ED.  Cardiac enzymes and EKG shows no obvious findings suggesting acute ischemia.  Cardiology input appreciated with plans for cardiac cath possibly as outpatient.  Echocardiogram has been ordered to evaluate for left ventricular wall motion abnormalities.  Suspected severe aortic valve stenosis: Coronary CT scan did suggest severe aortic stenosis based on calcium  score.  Patient was referred for echocardiogram.  Kindly referred to cardiology notes.  Further workup pending to decide if TAVR may be warranted.  Hypertension: Cardiology recommends reducing amlodipine  from 10 mg daily to 5 mg to prevent positional dizziness.  Will monitor blood pressure on current regimen.  Type 2  diabetes mellitus on insulin : Patient will be continued on insulin  therapy.  Insulin  sliding scale in place.  Fingerstick glucose monitoring per unit protocol.  ADA diet advised.  History of ulcerative colitis: Remains stable on current regimen.     Advance Care Planning:   Code Status: Full Code   Consults: Cardiology consult recommendations appreciated  Family Communication: Wife at bedside updated  Severity of Illness: The appropriate patient status for this patient is OBSERVATION. Observation status is judged to be reasonable and necessary in order to provide the required intensity of service to ensure the patient's safety. The patient's presenting symptoms, physical exam findings, and initial radiographic and laboratory data in the context of their medical condition is felt to place them at decreased risk for further clinical deterioration. Furthermore, it is anticipated that the patient will be medically stable for discharge from the hospital within 2 midnights of admission.   Author: Maude MARLA Dart, MD 02/10/2024 7:34 PM  For on call review [email or web address].     [1]  Allergies Allergen Reactions   Metformin  And Related Diarrhea   Trulicity [Dulaglutide] Nausea And Vomiting   Synjardy  [Empagliflozin -Metformin  Hcl] Diarrhea and Nausea Only   Olmesartan  Rash

## 2024-02-10 NOTE — Telephone Encounter (Signed)
 Received approval letter from [employer] ([employer] ) thru 02/28/2025,approval letter index.left a HIPAA VM at pt number.

## 2024-02-11 ENCOUNTER — Observation Stay (HOSPITAL_COMMUNITY)

## 2024-02-11 ENCOUNTER — Other Ambulatory Visit (HOSPITAL_BASED_OUTPATIENT_CLINIC_OR_DEPARTMENT_OTHER): Payer: Self-pay

## 2024-02-11 DIAGNOSIS — R079 Chest pain, unspecified: Secondary | ICD-10-CM

## 2024-02-11 LAB — ECHOCARDIOGRAM COMPLETE
AR max vel: 1.14 cm2
AV Area VTI: 1.08 cm2
AV Area mean vel: 1.05 cm2
AV Mean grad: 21 mmHg
AV Peak grad: 36.2 mmHg
Ao pk vel: 3.01 m/s
Area-P 1/2: 3.93 cm2
Calc EF: 61.4 %
Height: 70 in
S' Lateral: 2.5 cm
Single Plane A2C EF: 67.5 %
Single Plane A4C EF: 55.9 %
Weight: 3008 [oz_av]

## 2024-02-11 LAB — CBC
HCT: 45 % (ref 39.0–52.0)
Hemoglobin: 15.3 g/dL (ref 13.0–17.0)
MCH: 30.1 pg (ref 26.0–34.0)
MCHC: 34 g/dL (ref 30.0–36.0)
MCV: 88.6 fL (ref 80.0–100.0)
Platelets: 180 K/uL (ref 150–400)
RBC: 5.08 MIL/uL (ref 4.22–5.81)
RDW: 12.9 % (ref 11.5–15.5)
WBC: 7.4 K/uL (ref 4.0–10.5)
nRBC: 0 % (ref 0.0–0.2)

## 2024-02-11 LAB — CBG MONITORING, ED
Glucose-Capillary: 141 mg/dL — ABNORMAL HIGH (ref 70–99)
Glucose-Capillary: 156 mg/dL — ABNORMAL HIGH (ref 70–99)

## 2024-02-11 MED ORDER — AMLODIPINE BESYLATE 5 MG PO TABS: 5.0000 mg | ORAL_TABLET | Freq: Every day | ORAL | 0 refills | Status: AC

## 2024-02-11 NOTE — ED Notes (Signed)
 AVS with prescriptions provided to and discussed with patient and family member at bedside. Pt verbalizes understanding of discharge instructions and denies any questions or concerns at this time. Pt has ride home. Pt ambulated out of department independently with steady gait.

## 2024-02-11 NOTE — Progress Notes (Signed)
° °  Progress Note  Patient Name: Bruce Sanchez Date of Encounter: 02/11/2024  Primary Cardiologist: Lynwood Schilling, MD   Subjective   Feels well -- no chest pain since admission. Today, he tells me he has had symptoms only with exertion.  Inpatient Medications    Scheduled Meds:  amLODipine   10 mg Oral Daily   aspirin  EC  81 mg Oral Daily   heparin   5,000 Units Subcutaneous Q8H   hydrALAZINE   25 mg Oral TID   insulin  aspart  0-15 Units Subcutaneous TID WC   insulin  glargine  20 Units Subcutaneous Daily   isosorbide  mononitrate  30 mg Oral Daily   metoprolol  succinate  50 mg Oral Daily   rosuvastatin   20 mg Oral Daily   sulfaSALAzine   1,000 mg Oral TID   Continuous Infusions:  PRN Meds: albuterol , metoprolol  tartrate, nitroGLYCERIN , ondansetron , oxyCODONE    Vital Signs    Vitals:   02/11/24 0500 02/11/24 0600 02/11/24 0800 02/11/24 0801  BP: (!) 152/76 133/71 137/72 137/72  Pulse: 78 67 87   Resp: 18  16   Temp: 98 F (36.7 C)  98.1 F (36.7 C)   TempSrc: Oral  Oral   SpO2: 96% 98% 97%   Weight:      Height:       No intake or output data in the 24 hours ending 02/11/24 0839 Filed Weights   02/10/24 1657  Weight: 85.3 kg    Telemetry    Sinus rhythm - Personally Reviewed  ECG    Sinus rhythm- Personally Reviewed  Physical Exam   GEN: No acute distress.   Neck: No JVD Cardiac: RRR, no murmurs, rubs, or gallops.  Respiratory: Clear to auscultation bilaterally. GI: Soft, nontender, non-distended  MS: No edema; No deformity. Neuro:  Nonfocal  Psych: Normal affect   Labs    Chemistry Recent Labs  Lab 02/10/24 1704  NA 137  K 3.7  CL 98  CO2 30  GLUCOSE 262*  BUN 14  CREATININE 0.56*  CALCIUM  8.8*  GFRNONAA >60  ANIONGAP 9     Hematology Recent Labs  Lab 02/10/24 1704 02/11/24 0504  WBC 7.5 7.4  RBC 4.96 5.08  HGB 15.3 15.3  HCT 44.9 45.0  MCV 90.5 88.6  MCH 30.8 30.1  MCHC 34.1 34.0  RDW 12.9 12.9  PLT 208 180     Cardiac EnzymesNo results for input(s): TROPONINI in the last 168 hours. No results for input(s): TROPIPOC in the last 168 hours.   BNP Recent Labs  Lab 02/10/24 1704  BNP 24.5     DDimer No results for input(s): DDIMER in the last 168 hours.   Summary of Pertinent studies    TTE: Pending  Cardiac cath:   Imaging: CT coronary February 08, 2024 --diffuse, moderate coronary disease.  Severe aortic valve calcification  Labs: Reviewed  Patient Profile     68 y.o. male with a history of disease, aortic stenosis, ulcerative colitis, hypertension, hyperlipidemia, diabetes admitted with chest pain  Assessment & Plan    Chest pain -  Aortic stenosis Coronary artery disease Appears to be stable exertional symptoms Echocardiogram ordered I think it would be reasonable to DC after TTE for ongoing outpatient evaluation    For questions or updates, please contact [facility] Please consult [email or web address] for contact info under Cardiology/STEMI.      Signed, Eulas FORBES Furbish, MD 02/11/2024, 8:39 AM

## 2024-02-11 NOTE — Progress Notes (Signed)
°  Echocardiogram 2D Echocardiogram has been performed.  LAMON MAXWELL 02/11/2024, 10:02 AM

## 2024-02-11 NOTE — Discharge Summary (Signed)
 Physician Discharge Summary  Bruce Sanchez FMW:[id number] DOB: 1955/04/03 DOA: 02/10/2024  PCP: Joshua Debby CROME, MD  Admit date: 02/10/2024 Discharge date: 02/11/2024  Admitted From: Home Disposition: Home  Recommendations for Outpatient Follow-up:  Follow up with PCP in 1-2 weeks Follow-up cardiology, Dr. Lavona scheduled  Home Health: None Equipment/Devices: None  Discharge Condition: Stable CODE STATUS: Full Diet recommendation: Low-salt low-fat low-carb diet  Brief/Interim Summary: Bruce Sanchez is a 68 y.o. male with medical history significant of Hypertension, Type II DM on Insulin  therapy, Hyperlipidemia, Ulcerative colitis, CAD as seen on Cardiac CT.Patient presented earlier today to cardiology with concerns of progressive shortness and chest pain.  Patient admitted as above with atypical chest pain, somewhat worsening with exertion, lifting heavy objects as well as notably exacerbated by palpation.  Reassuringly patient's troponin is negative EKG without any ST elevations, cardiology evaluating, given echocardiogram with moderate aortic stenosis which was initially thought to be more severe and causing his symptoms.  At this time he is otherwise stable and agreeable for discharge home, medication changes as outlined below otherwise discussed need for improved diet in regards to diabetes.  Plan follow-up with Dr. Lavona his cardiologist as well as PCP in the next few weeks for further evaluation and to resume previous evaluation for TAVR.  Discharge Diagnoses:  Principal Problem:   Chest pain in adult  Acute chest pain/shortness of breath:  ACS ruled out Moderate aortic stenosis -Cardiology following, appreciate insight recommendations, echo without severe aortic stenosis -Troponin negative, EKG without ST elevations - Chest pain reproducible with palpation, noted to be worse with echocardiogram but patient does have known risk factors and underlying cardiovascular  disease.   Hypertension: Decrease amlodipine  to 5 mg per cardiology given positional dizziness  Well-controlled insulin -dependent diabetes type 2 - A1c 5.9; continue home regimen, discussed at length need for improved diabetic diet   History of ulcerative colitis: Remains stable on current regimen.  Discharge Instructions   Allergies as of 02/11/2024       Reactions   Metformin  And Related Diarrhea   Trulicity [dulaglutide] Nausea And Vomiting   Synjardy  [empagliflozin -metformin  Hcl] Diarrhea, Nausea Only   Olmesartan  Rash        Medication List     STOP taking these medications    mesalamine  1000 MG suppository Commonly known as: Canasa    ondansetron  4 MG tablet Commonly known as: ZOFRAN    pramoxine 1 % foam Commonly known as: Proctofoam       TAKE these medications    amLODipine  10 MG tablet Commonly known as: NORVASC  TAKE 1 TABLET BY MOUTH DAILY   aspirin  EC 81 MG tablet Take 1 tablet (81 mg total) by mouth daily. Swallow whole. What changed:  when to take this reasons to take this   Blood Glucose Monitoring Suppl Devi 1 each by Does not apply route in the morning, at noon, and at bedtime. May substitute to any manufacturer covered by patient's insurance.   BLOOD GLUCOSE TEST STRIPS Strp Use to check blood glucose up to 3 times daily. May substitute to any manufacturer covered by patient's insurance.   Droplet Pen Needles 32G X 6 MM Misc Generic drug: Insulin  Pen Needle Use to inject insulin  once daily.   hydrALAZINE  25 MG tablet Commonly known as: APRESOLINE  Take 1 tablet (25 mg total) by mouth 3 (three) times daily.   hydrocortisone  2.5 % rectal cream Commonly known as: ANUSOL -HC Place 1 Application rectally daily as needed for hemorrhoids or anal itching.   indapamide  1.25  MG tablet Commonly known as: LOZOL  Take 1 tablet (1.25 mg total) by mouth daily.   isosorbide  mononitrate 30 MG 24 hr tablet Commonly known as: IMDUR  Take 1 tablet  (30 mg total) by mouth daily.   Lancets Misc. Misc Use to check blood glucose up to 3 times daily. May substitute to any manufacturer covered by patient's insurance.   metFORMIN  750 MG 24 hr tablet Commonly known as: GLUCOPHAGE -XR Take 1 tablet (750 mg total) by mouth 2 (two) times daily.   metoprolol  succinate 50 MG 24 hr tablet Commonly known as: TOPROL -XL TAKE 1 TABLET BY MOUTH DAILY WITH OR IMMEDIATELY AFTER A MEAL   nitroGLYCERIN  0.4 MG SL tablet Commonly known as: NITROSTAT  Place 1 tablet (0.4 mg total) under the tongue every 5 (five) minutes as needed for chest pain.   rosuvastatin  20 MG tablet Commonly known as: CRESTOR  Take 1 tablet (20 mg total) by mouth daily.   sulfaSALAzine  500 MG tablet Commonly known as: AZULFIDINE  Take 2 tablets (1,000 mg total) by mouth 3 (three) times daily.   Toujeo  Max SoloStar 300 UNIT/ML Solostar Pen Generic drug: insulin  glargine (2 Unit Dial ) Inject 20 Units into the skin every evening.        Allergies[1]  Consultations: Cardiology   Procedures/Studies: ECHOCARDIOGRAM COMPLETE Result Date: 02/11/2024    ECHOCARDIOGRAM REPORT   Patient Name:   Bruce Sanchez Date of Exam: 02/11/2024 Medical Rec #:  [id number]     Height:       70.0 in Accession #:    [id number]    Weight:       188.0 lb Date of Birth:  Sep 24, 1955     BSA:          2.033 m Patient Age:    68 years      BP:           137/72 mmHg Patient Gender: M             HR:           82 bpm. Exam Location:  Inpatient Procedure: 2D Echo (Both Spectral and Color Flow Doppler were utilized during            procedure). Indications:    aortic stenosis  History:        Patient has prior history of Echocardiogram examinations, most                 recent 06/02/2021. CAD, Signs/Symptoms:Murmur; Risk                 Factors:Hypertension, Diabetes and Dyslipidemia.  Sonographer:    Tinnie Barefoot RDCS Referring Phys: [id number] [facility] J PATWARDHAN  Sonographer Comments: Suboptimal subcostal  window. IMPRESSIONS  1. Left ventricular ejection fraction, by estimation, is 60 to 65%. The left ventricle has normal function. The left ventricle has no regional wall motion abnormalities. There is mild left ventricular hypertrophy. Left ventricular diastolic parameters are consistent with Grade I diastolic dysfunction (impaired relaxation).  2. Right ventricular systolic function is normal. The right ventricular size is normal.  3. The mitral valve is normal in structure. No evidence of mitral valve regurgitation. No evidence of mitral stenosis.  4. The aortic valve is calcified. Aortic valve regurgitation is not visualized. Moderate aortic valve stenosis. FINDINGS  Left Ventricle: Left ventricular ejection fraction, by estimation, is 60 to 65%. The left ventricle has normal function. The left ventricle has no regional wall motion abnormalities. The left ventricular internal cavity size was normal in  size. There is  mild left ventricular hypertrophy. Left ventricular diastolic parameters are consistent with Grade I diastolic dysfunction (impaired relaxation). Right Ventricle: The right ventricular size is normal. Right ventricular systolic function is normal. Left Atrium: Left atrial size was normal in size. Right Atrium: Right atrial size was normal in size. Pericardium: There is no evidence of pericardial effusion. Mitral Valve: The mitral valve is normal in structure. There is mild calcification of the mitral valve leaflet(s). No evidence of mitral valve regurgitation. No evidence of mitral valve stenosis. Tricuspid Valve: The tricuspid valve is not well visualized. Tricuspid valve regurgitation is trivial. No evidence of tricuspid stenosis. Aortic Valve: The aortic valve is calcified. Aortic valve regurgitation is not visualized. Moderate aortic stenosis is present. Aortic valve mean gradient measures 21.0 mmHg. Aortic valve peak gradient measures 36.2 mmHg. Aortic valve area, by VTI measures 1.08 cm.  Pulmonic Valve: The pulmonic valve was normal in structure. Pulmonic valve regurgitation is not visualized. No evidence of pulmonic stenosis. Aorta: The aortic root is normal in size and structure. Venous: The inferior vena cava was not well visualized. IAS/Shunts: The interatrial septum was not well visualized.  LEFT VENTRICLE PLAX 2D LVIDd:         4.30 cm     Diastology LVIDs:         2.50 cm     LV e' medial:    6.96 cm/s LV PW:         1.10 cm     LV E/e' medial:  10.1 LV IVS:        1.40 cm     LV e' lateral:   8.70 cm/s LVOT diam:     2.20 cm     LV E/e' lateral: 8.1 LV SV:         60 LV SV Index:   30 LVOT Area:     3.80 cm LV IVRT:       39 msec  LV Volumes (MOD) LV vol d, MOD A2C: 49.8 ml LV vol d, MOD A4C: 62.2 ml LV vol s, MOD A2C: 16.2 ml LV vol s, MOD A4C: 27.4 ml LV SV MOD A2C:     33.6 ml LV SV MOD A4C:     62.2 ml LV SV MOD BP:      34.2 ml RIGHT VENTRICLE RV Basal diam:  2.70 cm RV S prime:     15.00 cm/s TAPSE (M-mode): 1.7 cm LEFT ATRIUM             Index        RIGHT ATRIUM           Index LA diam:        3.20 cm 1.57 cm/m   RA Area:     14.60 cm LA Vol (A2C):   32.3 ml 15.89 ml/m  RA Volume:   29.30 ml  14.41 ml/m LA Vol (A4C):   57.8 ml 28.43 ml/m LA Biplane Vol: 47.8 ml 23.51 ml/m  AORTIC VALVE AV Area (Vmax):    1.14 cm AV Area (Vmean):   1.05 cm AV Area (VTI):     1.08 cm AV Vmax:           301.00 cm/s AV Vmean:          219.000 cm/s AV VTI:            0.559 m AV Peak Grad:      36.2 mmHg AV Mean Grad:  21.0 mmHg LVOT Vmax:         90.00 cm/s LVOT Vmean:        60.250 cm/s LVOT VTI:          0.159 m LVOT/AV VTI ratio: 0.28  AORTA Ao Root diam: 3.70 cm Ao Asc diam:  3.50 cm MITRAL VALVE MV Area (PHT): 3.93 cm    SHUNTS MV Decel Time: 193 msec    Systemic VTI:  0.16 m MV E velocity: 70.40 cm/s  Systemic Diam: 2.20 cm MV A velocity: 94.00 cm/s MV E/A ratio:  0.75 Redell Shallow MD Electronically signed by Redell Shallow MD Signature Date/Time: 02/11/2024/11:29:14 AM    Final     DG Chest 2 View Result Date: 02/10/2024 CLINICAL DATA:  Shortness of breath EXAM: CHEST - 2 VIEW COMPARISON:  CT 02/15/2023 FINDINGS: No acute airspace disease or pleural effusion. Minimal atelectasis at the lingula. Aortic atherosclerosis. Diffuse ankylosis of the spine. IMPRESSION: No active cardiopulmonary disease. Minimal atelectasis at the lingula. Electronically Signed   By: Luke Bun M.D.   On: 02/10/2024 19:02   CT CORONARY MORPH W/CTA COR W/SCORE W/CA W/CM &/OR WO/CM Addendum Date: 02/08/2024 ADDENDUM REPORT: 02/08/2024 17:41 EXAM: OVER-READ INTERPRETATION  CT CHEST The following report is an over-read performed by radiologist Dr. Ozell Medal [facility] on 02/08/2024. This over-read does not include interpretation of cardiac or coronary anatomy or pathology. The coronary CTA interpretation by the cardiologist is attached. COMPARISON:  02/15/2023 FINDINGS: Mediastinum: No pathologic adenopathy. Visualized portions of the tracheobronchial tree and esophagus are unremarkable. Lungs: No acute airspace disease, effusion, or pneumothorax. Central airways are patent. Abdomen: No acute findings. Musculoskeletal: No acute or destructive bony abnormalities. Reconstructed images demonstrate no additional findings. IMPRESSION: 1. No acute or significant noncardiac findings. Electronically Signed   By: Ozell Daring M.D.   On: 02/08/2024 17:41   Result Date: 02/08/2024 HISTORY: Chest pain/anginal equiv, high CAD risk, not treadmill candidate EXAM: Cardiac/Coronary  CT PROTOCOL: A non-contrast, gated CT scan was obtained with axial slices of 2.5 mm through the heart for calcium  scoring. Calcium  scoring was performed using the Agatston method. A 120 kV prospective, gated, contrast cardiac CT scan was obtained. Gantry rotation speed was 230 msec and collimation was 0.63 mm. Two sublingual nitroglycerin  tablets (0.8 mg) were given. The 3D data set was reconstructed with motion correction  for the best systolic or diastolic phase. Images were analyzed on a dedicated workstation using MPR, MIP, and VRT modes. The patient received 95 cc of contrast. FINDINGS: Image quality: Average. Artifact: Limited - blooming artifact. Coronary calcium  score is 5,585, which places the patient in the 99th percentile for age and sex matched control. Coronary arteries: Normal coronary origins.  Right dominance. Left Main Coronary Artery: Trifurcates into left anterior descending artery (LAD), left circumflex artery (LCX), and ramus intermedius (RI). Mild (25-49%) stenosis due to calcified plaque at distal left main. Left Anterior Descending Artery: Normal caliber vessel, wrap the apex, gives off 1 diagonal branch. Long segment of mild stenosis (25-49%) due mixed (predominantly calcified) plaque in proximal LAD. Moderate stenosis (50-69%) due to calcified plaque at mid LAD just distal to D1. Diffuse disease with tandem lesions in the mid to distal LAD due to mixed plaque likely moderate plaque burden (50-69%). Accuray is limited due to dense calcification causing blooming artifact and smaller caliber vessel distally. First Diagonal branch: Mild (25-49% plaque at ostium, moderate stenosis (50-69%) due to soft plaque at mid D1, otherwise patent Ramus intermedius: Mild (  25-49%) soft plaque at the ostium, moderate stenosis (50-69%) due to soft plaque at mid segment, otherwise patent. Left Circumflex Artery: Mild (25-49%) diffuse disease predominantly calcified plaque at ostium, proximal, and mid segments. Distal segment tapers off with luminal irregularities due to calcified plaque. First Obtuse Marginal branch: Patent Second Obtuse Marginal branch: Patent Third Obtuse Marginal branch: Patent Right Coronary Artery: Diffuse disease. Moderate stenosis (50-69%) at proximal RCA due to calcified plaque, mild stenosis (25-49%) at mid RCA due to calcified plaque, diffuse mixed plaque within the distal RCA. Mild stenosis (25-49%) at  proximal PAD due to calcified plaque. Aorta: Normal size, 38 mm at the mid ascending aorta (level of the PA bifurcation) measured double oblique. Aortic atherosclerosis. Aortic Valve: Native valve, trileaflet aortic valve, valvular calcification. Aortic valve calcium  score 2,772 suggestive of severe aortic stenosis. Mitral valve: Native valve, no significant mitral annular calcification. Other findings: Normal pulmonary vein drainage into the left atrium. Normal left atrial appendage without thrombus. Mild dilatation of main pulmonary artery, 31 mm, may indicate increased pulmonary pressures. Patent foramen ovale. Please see separate report from [facility] for non-cardiac findings. IMPRESSION: 1. Coronary calcium  score of 5,585. This was 99th percentile for age and sex matched control. 2.  Normal coronary origins with right dominance. 3.  CAD-RADS 3 Moderate Coronary artery disease. 4. Mild (25-49%) stenosis due to calcified plaque at distal left main. 5. Moderate stenosis (50-69%) due to calcified plaque at mid LAD just distal to D1. Diffuse disease with tandem lesions in the mid to distal LAD due to mixed plaque likely moderate plaque burden (50-69%). Accuray is limited due to dense calcification causing blooming artifact and smaller vessel caliber distally. 6.  Moderate stenosis (50-69%) due to soft plaque at mid Ramus. 7. Mild (25-49%) diffuse disease predominantly calcified plaque at ostium, proximal, and mid LCx. 8. Moderate stenosis (50-69%) at proximal RCA due to calcified plaque. 9. Aortic valve calcium  score 2,772 suggestive of severe aortic stenosis, correlate with echocardiography. 10. Aortic atherosclerosis. 11. Mild dilatation of main pulmonary artery, 31 mm, may indicate increased pulmonary pressures. 12. Patent foramen ovale. 13. Due to study limitations CT FFR data is not available at this time. We resubmitted the dataset to [facility] but it was not processed. RECOMMENDATION: Consider  symptom-guided anti-ischemic pharmacotherapy as well as risk factor modification per guideline directed care. Cardiology consultation recommended. Consider either invasive angiography or different imaging modality to evaluate for ischemia, clinical correlation is required. Electronically Signed: By: Madonna Large On: 02/08/2024 17:36     Subjective: No acute issues or events overnight chest pain resolved denies nausea vomiting diarrhea constipation headache fevers chills   Discharge Exam: Vitals:   02/11/24 1100 02/11/24 1132  BP: (!) 93/57   Pulse: 72   Resp: 10   Temp:  98.8 F (37.1 C)  SpO2: 96%    Vitals:   02/11/24 0800 02/11/24 0801 02/11/24 1100 02/11/24 1132  BP: 137/72 137/72 (!) 93/57   Pulse: 87  72   Resp: 16  10   Temp: 98.1 F (36.7 C)   98.8 F (37.1 C)  TempSrc: Oral   Oral  SpO2: 97%  96%   Weight:      Height:        General:  Pleasantly resting in bed, No acute distress. HEENT:  Normocephalic atraumatic.  Sclerae nonicteric, noninjected.  Extraocular movements intact bilaterally. Neck:  Without mass or deformity.  Trachea is midline. Lungs/Chest:  Clear to auscultate bilaterally without rhonchi, wheeze, or rales.  Tenderness to palpation left anterior chest wall Heart:  Regular rate and rhythm.  Without murmurs, rubs, or gallops. Abdomen:  Soft, nontender, nondistended.  Without guarding or rebound. Extremities: Without cyanosis, clubbing, edema, or obvious deformity. Skin:  Warm and dry, no erythema.   The results of significant diagnostics from this hospitalization (including imaging, microbiology, ancillary and laboratory) are listed below for reference.     Microbiology: No results found for this or any previous visit (from the past 240 hours).   Labs: BNP (last 3 results) Recent Labs    02/10/24 1704  BNP 24.5   Basic Metabolic Panel: Recent Labs  Lab 02/10/24 1704  NA 137  K 3.7  CL 98  CO2 30  GLUCOSE 262*  BUN 14  CREATININE  0.56*  CALCIUM  8.8*   CBC: Recent Labs  Lab 02/10/24 1704 02/11/24 0504  WBC 7.5 7.4  HGB 15.3 15.3  HCT 44.9 45.0  MCV 90.5 88.6  PLT 208 180   CBG: Recent Labs  Lab 02/10/24 2043 02/11/24 0728 02/11/24 1157  GLUCAP 154* 141* 156*   Urinalysis    Component Value Date/Time   COLORURINE ORANGE (A) 01/11/2024 1554   APPEARANCEUR CLEAR 01/11/2024 1554   LABSPEC >=1.030 (A) 01/11/2024 1554   PHURINE 5.0 01/11/2024 1554   GLUCOSEU NEGATIVE 01/11/2024 1554   HGBUR NEGATIVE 01/11/2024 1554   BILIRUBINUR SMALL (A) 01/11/2024 1554   KETONESUR TRACE (A) 01/11/2024 1554   UROBILINOGEN 1.0 01/11/2024 1554   NITRITE NEGATIVE 01/11/2024 1554   LEUKOCYTESUR NEGATIVE 01/11/2024 1554   Sepsis Labs Recent Labs  Lab 02/10/24 1704 02/11/24 0504  WBC 7.5 7.4   Microbiology No results found for this or any previous visit (from the past 240 hours).   Time coordinating discharge: Over 30 minutes  SIGNED:   Elsie JAYSON [place], DO [facility] 02/11/2024, 12:52 PM Pager   If 7PM-7AM, please contact night-coverage [email or web address]     [1]  Allergies Allergen Reactions   Metformin  And Related Diarrhea   Trulicity [Dulaglutide] Nausea And Vomiting   Synjardy  [Empagliflozin -Metformin  Hcl] Diarrhea and Nausea Only   Olmesartan  Rash

## 2024-02-13 ENCOUNTER — Telehealth: Payer: Self-pay | Admitting: *Deleted

## 2024-02-13 NOTE — Telephone Encounter (Addendum)
 I received staff message 02/12/24 from [facility] patient had been scheduled for [facility] 12/23 per Dr Gilda request and message had been sent to pre-cert.   I reviewed procedure instructions with Carmela Auguste (D[facility])-will also MyChart message with instructions.  Cardiac Catheterization scheduled at [facility] for: Tuesday February 21, 2024 12 Noon Arrival time [facility] Main Entrance A at: 10 AM  Diet: -May have light meal until 6 AM. (6 hours before procedure time) Approved light meal consists of plain toast, fruit, light soups, crackers.   Hydration: -May drink clear liquids until 2 hours before the procedure.  Approved liquids: Water, clear tea, black coffee, fruit juices-non-citric and without pulp,Gatorade, plain Jello/popsicles.   -Please drink 16 oz of water 2 hours before procedure.  Medication instructions: -Hold:  Insulin -1/2 usual dose evening prior to procedure  Carmela tells me patient does not use Insulin  in the mornings.  Metformin -day of procedure and 48 hours after procedure -Other usual morning medications can be taken including aspirin  81 mg.  Plan to go home the same day, you will only stay overnight if medically necessary.  You must have responsible adult to drive you home.  Someone must be with you the first 24 hours after you arrive home.

## 2024-02-14 ENCOUNTER — Other Ambulatory Visit: Admitting: Pharmacist

## 2024-02-14 ENCOUNTER — Other Ambulatory Visit (HOSPITAL_BASED_OUTPATIENT_CLINIC_OR_DEPARTMENT_OTHER): Payer: Self-pay

## 2024-02-14 ENCOUNTER — Telehealth: Payer: Self-pay

## 2024-02-14 DIAGNOSIS — E1129 Type 2 diabetes mellitus with other diabetic kidney complication: Secondary | ICD-10-CM

## 2024-02-14 MED ORDER — TOUJEO MAX SOLOSTAR 300 UNIT/ML ~~LOC~~ SOPN
20.0000 [IU] | PEN_INJECTOR | Freq: Every evening | SUBCUTANEOUS | 1 refills | Status: AC
  Filled 2024-02-14: qty 6, 90d supply, fill #0

## 2024-02-14 NOTE — Telephone Encounter (Signed)
 Gave pt a call pt is on spreadsheet for re-enrollment on Bicares(synjardy ) spoke with pt wife explain pt is no longer taking this medication due to a reaction to the medication and was taken off.no need to do pap.

## 2024-02-14 NOTE — Progress Notes (Addendum)
 02/14/2024 Name: Bruce Sanchez MRN: [id number] DOB: [birth year]  Chief Complaint  Patient presents with   Diabetes   Hypertension   Medication Management    Bruce Sanchez is a 68 y.o. year old male who presented for a telephone visit.   They were referred to the pharmacist by their PCP for assistance in managing hypertension.   Subjective:  Care Team: Primary Care Provider: Joshua Debby CROME, MD ; Next Scheduled Visit: 04/17/24  Medication Access/Adherence  Current Pharmacy:  [facility] [id number] - [place], [place] - [address] [place] [address] Phone: [phone] Fax: [phone]  [facility] [place] [facility] [address] [place] [address] Phone: [phone] Fax: [phone]   Patient reports affordability concerns with their medications: Yes  Patient reports access/transportation concerns to their pharmacy: No  Patient reports adherence concerns with their medications:  Yes    Notes Toujeo  is expensive ($105 for 90DS) and pen needles are expensive ($50)  Recent ED visit for SOB. Pt scheduled for cath 12/23  Diabetes:  Current medications: Toujeo  20 units daily, metformin  ER 750 mg twice daily Medications tried in the past: Synjardy  (nausea, diarrhea)  BG became elevated after stopping Synjardy   Pt denies change to diet. Possibly slightly less active due to recent eye surgery 2 weeks ago, however BG have been elevated since prior to surgery.  Current glucose readings:  Fasting 110, highest 157  Patient denies hypoglycemic s/sx including dizziness, shakiness, sweating. Patient denies hyperglycemic symptoms including polyuria, polydipsia, polyphagia, nocturia, neuropathy, blurred vision.   Macrovascular and Microvascular Risk Reduction:  Statin? yes (rosuvastatin ); ACEi/ARB? Not currently, hx of rash with olmesartan  Last urinary albumin/creatinine ratio:  Lab Results  Component Value  Date   MICRALBCREAT 916.8 (H) 01/11/2024   MICRALBCREAT 581.7 (H) 08/03/2023   MICRALBCREAT 197 06/03/2020   Last eye exam:  Lab Results  Component Value Date   HMDIABEYEEXA Retinopathy (A) 12/14/2023   Last foot exam: 08/03/2023 Tobacco Use:  Tobacco Use: Low Risk (02/10/2024)   Patient History    Smoking Tobacco Use: Never    Smokeless Tobacco Use: Never    Passive Exposure: Never   Hypertension:  Current medications: Amlodipine  10 mg daily, metoprolol  succinate 50 mg daily, indapamide  1.25 mg daily, hydralazine  25 mg TID Medications previously tried: Olmesartan  (rash)  Patient has a validated, automated, upper arm home BP cuff Current blood pressure readings readings: 138/63 today  Patient denies hypotensive s/sx including dizziness, lightheadedness.  Patient denies hypertensive symptoms including headache, chest pain, shortness of breath   Objective:  BP Readings from Last 3 Encounters:  02/11/24 94/61  02/10/24 (!) 138/50  02/06/24 122/63     Lab Results  Component Value Date   HGBA1C 5.9 01/11/2024    Lab Results  Component Value Date   CREATININE 0.56 (L) 02/10/2024   BUN 14 02/10/2024   NA 137 02/10/2024   K 3.7 02/10/2024   CL 98 02/10/2024   CO2 30 02/10/2024    Lab Results  Component Value Date   CHOL 115 08/03/2023   HDL 39.80 08/03/2023   LDLCALC 42 08/03/2023   LDLDIRECT 126.0 08/18/2022   TRIG 170.0 (H) 08/03/2023   CHOLHDL 3 08/03/2023    Medications Reviewed Today     Reviewed by Bruce Sanchez, RPH (Pharmacist) on 02/14/24 at 1606  Med List Status: <None>   Medication Order Taking? Sig Documenting Provider Last Dose Status Informant  amLODipine  (NORVASC ) 5 MG tablet [id number] Yes Take 1  tablet (5 mg total) by mouth daily. Bruce Elsie BROCKS, MD  Active   aspirin  EC 81 MG tablet [id number]  Take 1 tablet (81 mg total) by mouth daily. Swallow whole.  Patient taking differently: Take 81 mg by mouth daily as needed. Swallow  whole.   Sanchez, [name], DO  Active Spouse/Significant Other  Blood Glucose Monitoring Suppl DEVI [id number]  1 each by Does not apply route in the morning, at noon, and at bedtime. May substitute to any manufacturer covered by patient's insurance. Bruce Debby CROME, MD  Active Spouse/Significant Other  Glucose Blood (BLOOD GLUCOSE TEST STRIPS) STRP [id number]  Use to check blood glucose up to 3 times daily. May substitute to any manufacturer covered by patient's insurance. Bruce Debby CROME, MD  Active Spouse/Significant Other  hydrALAZINE  (APRESOLINE ) 25 MG tablet [id number] Yes Take 1 tablet (25 mg total) by mouth 3 (three) times daily. Bruce Debby CROME, MD  Active Spouse/Significant Other  hydrocortisone  (ANUSOL -HC) 2.5 % rectal cream [id number]  Place 1 Application rectally daily as needed for hemorrhoids or anal itching. Sanchez, Bruce V, DO  Active Spouse/Significant Other  indapamide  (LOZOL ) 1.25 MG tablet [id number] Yes Take 1 tablet (1.25 mg total) by mouth daily. Bruce Debby CROME, MD  Active Spouse/Significant Other  insulin  glargine, 2 Unit Dial , (TOUJEO  MAX SOLOSTAR) 300 UNIT/ML Solostar Pen [id number] Yes Inject 20 Units into the skin every evening. Bruce Debby CROME, MD  Active   Insulin  Pen Needle (DROPLET PEN NEEDLES) 32G X 6 MM MISC [id number]  Use to inject insulin  once daily. Bruce Debby CROME, MD  Active Spouse/Significant Other  isosorbide  mononitrate (IMDUR ) 30 MG 24 hr tablet [id number]  Take 1 tablet (30 mg total) by mouth daily. Bruce Debby CROME, MD  Active Spouse/Significant Other           Med Note Bruce Sanchez, Bruce Sanchez   Fri Feb 10, 2024  7:24 PM) Hasn't started  Lancets Misc. MISC [id number]  Use to check blood glucose up to 3 times daily. May substitute to any manufacturer covered by patient's insurance. Bruce Debby CROME, MD  Active Spouse/Significant Other  metFORMIN  (GLUCOPHAGE -XR) 750 MG 24 hr tablet [id number] Yes Take 1 tablet (750 mg total) by mouth 2 (two) times daily. Bruce Debby CROME,  MD  Active Spouse/Significant Other  metoprolol  succinate (TOPROL -XL) 50 MG 24 hr tablet [id number] Yes TAKE 1 TABLET BY MOUTH DAILY WITH OR IMMEDIATELY AFTER A MEAL Bruce Debby CROME, MD  Active Spouse/Significant Other  nitroGLYCERIN  (NITROSTAT ) 0.4 MG SL tablet [id number]  Place 1 tablet (0.4 mg total) under the tongue every 5 (five) minutes as needed for chest pain. Sanchez, [name], DO  Active Spouse/Significant Other           Med Note Bruce Sanchez, MUHAMMAD Sanchez   Fri Feb 10, 2024  7:25 PM) Hasn't started  rosuvastatin  (CRESTOR ) 20 MG tablet [id number] Yes Take 1 tablet (20 mg total) by mouth daily. Bruce Debby CROME, MD  Active Spouse/Significant Other  sulfaSALAzine  (AZULFIDINE ) 500 MG tablet [id number]  Take 2 tablets (1,000 mg total) by mouth 3 (three) times daily. Bruce Debby CROME, MD  Active Spouse/Significant Other              Assessment/Plan:   Diabetes: - Currently controlled; goal A1c <7%. There is a discrepancy between recent A1c and recent elevated BG at home. BG have been improved more recently since restarting metformin . Cardiorenal risk reduction is optimized.. Blood pressure is at goal <130/80. LDL is at goal.  - Recommend to  continue Toujeo  20 units daily and metformin  ER 750 mg twice daily. Sent Rxs for these to pharmacy. Sent to [facility] to see if can get pen needles cheaper. - Applied for PAP for basal insulin  - change to Basaglar  to get through [employer] - Recommended to check BG twice a day, at different times of the day to see if BG is lower/higher at different times.   Hypertension: - Currently controlled, BP goal <130/80 - Reviewed long term cardiovascular and renal outcomes of uncontrolled blood pressure - Reviewed appropriate blood pressure monitoring technique and reviewed goal blood pressure. Recommended to check home blood pressure and heart rate  - Recommend to continue current regimen.    Follow Up Plan: ongoing cardio f/u, PCP f.u  2/18  Darrelyn Drum, PharmD, BCPS, CPP Clinical Pharmacist Practitioner [facility] [phone]

## 2024-02-15 ENCOUNTER — Other Ambulatory Visit (HOSPITAL_BASED_OUTPATIENT_CLINIC_OR_DEPARTMENT_OTHER): Payer: Self-pay

## 2024-02-15 ENCOUNTER — Inpatient Hospital Stay: Admission: RE | Admit: 2024-02-15 | Discharge: 2024-02-15 | Attending: Internal Medicine | Admitting: Internal Medicine

## 2024-02-15 DIAGNOSIS — I119 Hypertensive heart disease without heart failure: Secondary | ICD-10-CM

## 2024-02-15 DIAGNOSIS — I10 Essential (primary) hypertension: Secondary | ICD-10-CM

## 2024-02-21 ENCOUNTER — Encounter (HOSPITAL_COMMUNITY): Admission: RE | Disposition: A | Payer: Self-pay | Attending: Cardiology

## 2024-02-21 ENCOUNTER — Encounter: Payer: Self-pay | Admitting: Physician Assistant

## 2024-02-21 ENCOUNTER — Other Ambulatory Visit: Payer: Self-pay

## 2024-02-21 ENCOUNTER — Ambulatory Visit (HOSPITAL_COMMUNITY)
Admission: RE | Admit: 2024-02-21 | Discharge: 2024-02-21 | Disposition: A | Discharge status: Home / Self Care | Attending: Cardiology | Admitting: Cardiology

## 2024-02-21 ENCOUNTER — Other Ambulatory Visit (HOSPITAL_COMMUNITY): Payer: Self-pay

## 2024-02-21 DIAGNOSIS — I25118 Atherosclerotic heart disease of native coronary artery with other forms of angina pectoris: Secondary | ICD-10-CM | POA: Diagnosis present

## 2024-02-21 DIAGNOSIS — Z7982 Long term (current) use of aspirin: Secondary | ICD-10-CM | POA: Insufficient documentation

## 2024-02-21 DIAGNOSIS — I350 Nonrheumatic aortic (valve) stenosis: Secondary | ICD-10-CM | POA: Insufficient documentation

## 2024-02-21 DIAGNOSIS — E785 Hyperlipidemia, unspecified: Secondary | ICD-10-CM | POA: Diagnosis not present

## 2024-02-21 DIAGNOSIS — Z7902 Long term (current) use of antithrombotics/antiplatelets: Secondary | ICD-10-CM | POA: Insufficient documentation

## 2024-02-21 DIAGNOSIS — Z79899 Other long term (current) drug therapy: Secondary | ICD-10-CM | POA: Insufficient documentation

## 2024-02-21 DIAGNOSIS — I2722 Pulmonary hypertension due to left heart disease: Secondary | ICD-10-CM | POA: Insufficient documentation

## 2024-02-21 DIAGNOSIS — I2584 Coronary atherosclerosis due to calcified coronary lesion: Secondary | ICD-10-CM | POA: Diagnosis not present

## 2024-02-21 DIAGNOSIS — I10 Essential (primary) hypertension: Secondary | ICD-10-CM | POA: Insufficient documentation

## 2024-02-21 DIAGNOSIS — Z794 Long term (current) use of insulin: Secondary | ICD-10-CM | POA: Insufficient documentation

## 2024-02-21 DIAGNOSIS — E119 Type 2 diabetes mellitus without complications: Secondary | ICD-10-CM | POA: Insufficient documentation

## 2024-02-21 DIAGNOSIS — Z7984 Long term (current) use of oral hypoglycemic drugs: Secondary | ICD-10-CM | POA: Diagnosis not present

## 2024-02-21 HISTORY — PX: RIGHT/LEFT HEART CATH AND CORONARY ANGIOGRAPHY: CATH118266

## 2024-02-21 HISTORY — PX: CORONARY ULTRASOUND/IVUS: CATH118244

## 2024-02-21 HISTORY — PX: CORONARY PRESSURE/FFR WITH 3D MAPPING: CATH118309

## 2024-02-21 HISTORY — PX: CORONARY STENT INTERVENTION: CATH118234

## 2024-02-21 HISTORY — PX: CORONARY PRESSURE/FFR STUDY: CATH118243

## 2024-02-21 LAB — POCT I-STAT EG7
Acid-Base Excess: 1 mmol/L (ref 0.0–2.0)
Bicarbonate: 26.4 mmol/L (ref 20.0–28.0)
Calcium, Ion: 1.16 mmol/L (ref 1.15–1.40)
HCT: 39 % (ref 39.0–52.0)
Hemoglobin: 13.3 g/dL (ref 13.0–17.0)
O2 Saturation: 76 %
Potassium: 3.4 mmol/L — ABNORMAL LOW (ref 3.5–5.1)
Sodium: 140 mmol/L (ref 135–145)
TCO2: 28 mmol/L (ref 22–32)
pCO2, Ven: 42.1 mmHg — ABNORMAL LOW (ref 44–60)
pH, Ven: 7.405 (ref 7.25–7.43)
pO2, Ven: 40 mmHg (ref 32–45)

## 2024-02-21 LAB — POCT I-STAT 7, (LYTES, BLD GAS, ICA,H+H)
Acid-Base Excess: 2 mmol/L (ref 0.0–2.0)
Bicarbonate: 25.7 mmol/L (ref 20.0–28.0)
Calcium, Ion: 1.17 mmol/L (ref 1.15–1.40)
HCT: 40 % (ref 39.0–52.0)
Hemoglobin: 13.6 g/dL (ref 13.0–17.0)
O2 Saturation: 96 %
Potassium: 3.5 mmol/L (ref 3.5–5.1)
Sodium: 141 mmol/L (ref 135–145)
TCO2: 27 mmol/L (ref 22–32)
pCO2 arterial: 38.2 mmHg (ref 32–48)
pH, Arterial: 7.436 (ref 7.35–7.45)
pO2, Arterial: 78 mmHg — ABNORMAL LOW (ref 83–108)

## 2024-02-21 LAB — GLUCOSE, CAPILLARY: Glucose-Capillary: 144 mg/dL — ABNORMAL HIGH (ref 70–99)

## 2024-02-21 LAB — POCT ACTIVATED CLOTTING TIME
Activated Clotting Time: 215 s
Activated Clotting Time: 240 s
Activated Clotting Time: 327 s
Activated Clotting Time: 220 s
Activated Clotting Time: 281 s
Activated Clotting Time: 189 s
Activated Clotting Time: 209 s

## 2024-02-21 MED ORDER — HEPARIN SODIUM (PORCINE) 1000 UNIT/ML IJ SOLN
INTRAMUSCULAR | Status: AC
  Filled 2024-02-21: qty 10

## 2024-02-21 MED ORDER — IOHEXOL 350 MG/ML SOLN
INTRAVENOUS | Status: DC | PRN
  Administered 2024-02-21: 160 mL

## 2024-02-21 MED ORDER — SODIUM CHLORIDE 0.9 % IV SOLN: 250.0000 mL | INTRAVENOUS | Status: DC | PRN

## 2024-02-21 MED ORDER — FREE WATER
500.0000 mL | Freq: Once | Status: AC
  Administered 2024-02-21: 500 mL via ORAL

## 2024-02-21 MED ORDER — HEPARIN SODIUM (PORCINE) 1000 UNIT/ML IJ SOLN
INTRAMUSCULAR | Status: DC | PRN
  Administered 2024-02-21: 5000 [IU] via INTRAVENOUS
  Administered 2024-02-21 (×2): 4000 [IU] via INTRAVENOUS
  Administered 2024-02-21: 4500 [IU] via INTRAVENOUS

## 2024-02-21 MED ORDER — NITROGLYCERIN 1 MG/10 ML FOR IR/CATH LAB
INTRA_ARTERIAL | Status: DC | PRN
  Administered 2024-02-21: 200 ug via INTRACORONARY

## 2024-02-21 MED ORDER — HYDRALAZINE HCL 20 MG/ML IJ SOLN: 10.0000 mg | INTRAMUSCULAR | Status: DC | PRN

## 2024-02-21 MED ORDER — LIDOCAINE HCL (PF) 1 % IJ SOLN
INTRAMUSCULAR | Status: DC | PRN
  Administered 2024-02-21 (×2): 5 mL via INTRADERMAL

## 2024-02-21 MED ORDER — LIDOCAINE HCL (PF) 1 % IJ SOLN
INTRAMUSCULAR | Status: AC
  Filled 2024-02-21: qty 30

## 2024-02-21 MED ORDER — LABETALOL HCL 5 MG/ML IV SOLN: 10.0000 mg | INTRAVENOUS | Status: DC | PRN

## 2024-02-21 MED ORDER — VERAPAMIL HCL 2.5 MG/ML IV SOLN
INTRAVENOUS | Status: AC
  Filled 2024-02-21: qty 2

## 2024-02-21 MED ORDER — MIDAZOLAM HCL 2 MG/2ML IJ SOLN
INTRAMUSCULAR | Status: AC
  Filled 2024-02-21: qty 2

## 2024-02-21 MED ORDER — VERAPAMIL HCL 2.5 MG/ML IV SOLN
INTRAVENOUS | Status: DC | PRN
  Administered 2024-02-21: 10 mL via INTRA_ARTERIAL

## 2024-02-21 MED ORDER — MIDAZOLAM HCL (PF) 2 MG/2ML IJ SOLN
INTRAMUSCULAR | Status: DC | PRN
  Administered 2024-02-21: 1 mg via INTRAVENOUS

## 2024-02-21 MED ORDER — FENTANYL CITRATE (PF) 100 MCG/2ML IJ SOLN
INTRAMUSCULAR | Status: AC
  Filled 2024-02-21: qty 2

## 2024-02-21 MED ORDER — FENTANYL CITRATE (PF) 100 MCG/2ML IJ SOLN
INTRAMUSCULAR | Status: DC | PRN
  Administered 2024-02-21 (×2): 25 ug via INTRAVENOUS

## 2024-02-21 MED ORDER — CLOPIDOGREL BISULFATE 75 MG PO TABS
75.0000 mg | ORAL_TABLET | Freq: Every day | ORAL | 3 refills | Status: AC
  Filled 2024-02-21: qty 90, 90d supply, fill #0

## 2024-02-21 MED ORDER — ACETAMINOPHEN 325 MG PO TABS: 650.0000 mg | ORAL_TABLET | ORAL | Status: DC | PRN

## 2024-02-21 MED ORDER — LABETALOL HCL 5 MG/ML IV SOLN
INTRAVENOUS | Status: AC
  Administered 2024-02-21: 10 mg via INTRAVENOUS
  Filled 2024-02-21: qty 4

## 2024-02-21 MED ORDER — ONDANSETRON HCL 4 MG/2ML IJ SOLN: 4.0000 mg | Freq: Four times a day (QID) | INTRAMUSCULAR | Status: DC | PRN

## 2024-02-21 MED ORDER — SODIUM CHLORIDE 0.9% FLUSH: 3.0000 mL | Freq: Two times a day (BID) | INTRAVENOUS | Status: DC

## 2024-02-21 MED ORDER — ASPIRIN 81 MG PO CHEW
81.0000 mg | CHEWABLE_TABLET | ORAL | Status: AC
  Administered 2024-02-21: 81 mg via ORAL
  Filled 2024-02-21: qty 1

## 2024-02-21 MED ORDER — CLOPIDOGREL BISULFATE 75 MG PO TABS: 75.0000 mg | ORAL_TABLET | Freq: Every day | ORAL | Status: DC

## 2024-02-21 MED ORDER — SODIUM CHLORIDE 0.9 % IV SOLN: INTRAVENOUS | Status: AC

## 2024-02-21 MED ORDER — SODIUM CHLORIDE 0.9% FLUSH: 3.0000 mL | INTRAVENOUS | Status: DC | PRN

## 2024-02-21 MED ORDER — CLOPIDOGREL BISULFATE 300 MG PO TABS
ORAL_TABLET | ORAL | Status: AC
  Filled 2024-02-21: qty 2

## 2024-02-21 MED ORDER — CLOPIDOGREL BISULFATE 300 MG PO TABS
ORAL_TABLET | ORAL | Status: DC | PRN
  Administered 2024-02-21: 600 mg via ORAL

## 2024-02-21 MED ORDER — HEPARIN (PORCINE) IN NACL 1000-0.9 UT/500ML-% IV SOLN
INTRAVENOUS | Status: DC | PRN
  Administered 2024-02-21: 1500 mL

## 2024-02-21 MED ORDER — NITROGLYCERIN 1 MG/10 ML FOR IR/CATH LAB
INTRA_ARTERIAL | Status: AC
  Filled 2024-02-21: qty 10

## 2024-02-21 NOTE — Progress Notes (Signed)
 Discharge instructions reviewed with patient and wife at bedside. Denies questions concerns. PT tolerated PO intake. Ambulated in the hallway, was able to void without difficulty. Seen by NP, Received meds from pharmacy, and an information packet from cardiac rehab. All incision sites remains clean dry and intact. No s/s of complications. PT escorted from the unit via wheel chair to personal vehicle.

## 2024-02-21 NOTE — Progress Notes (Signed)
 Site area: R fem vein Site Prior to Removal:  Level 0 Pressure Applied For: Manual:   yes Patient Status During Pull:  stable Post Pull Site:  Level 0 Post Pull Instructions Given:  yes with teach back Dressing Applied:  gauze with tegaderm Bedrest begins @ 1640 Comments: Bedrest for 3hrs

## 2024-02-21 NOTE — Discharge Instructions (Addendum)
 Information about your medication: Plavix  (anti-platelet agent)  Generic Name (Brand): clopidogrel  (Plavix ), once daily medication  PURPOSE: You are taking this medication along with aspirin  to lower your chance of having a heart attack, stroke, or blood clots in your heart stent. These can be fatal. Plavix  and aspirin  help prevent platelets from sticking together and forming a clot that can block an artery or your stent.   Common SIDE EFFECTS you may experience include: bruising or bleeding more easily, shortness of breath  Do not stop taking PLAVIX  without talking to the doctor who prescribes it for you. People who are treated with a stent and stop taking Plavix  too soon, have a higher risk of getting a blood clot in the stent, having a heart attack, or dying. If you stop Plavix  because of bleeding, or for other reasons, your risk of a heart attack or stroke may increase.   Tell all of your doctors and dentists that you are taking Plavix . They should talk to the doctor who prescribed Plavix  for you before you have any surgery or invasive procedure.   Contact your health care provider if you experience: severe or uncontrollable bleeding, pink/red/brown urine, vomiting blood or vomit that looks like coffee grounds, red or black stools (looks like tar), coughing up blood or blood clots ----------------------------------------------------------------------------------------------------------------------  Brachial Site Care   This sheet gives you information about how to care for yourself after your procedure. Your health care provider may also give you more specific instructions. If you have problems or questions, contact your health care provider. What can I expect after the procedure? After the procedure, it is common to have: Bruising and tenderness at the catheter insertion area. Follow these instructions at home:  Insertion site care Follow instructions from your health care provider  about how to take care of your insertion site. Make sure you: Wash your hands with soap and water  before you change your bandage (dressing). If soap and water  are not available, use hand sanitizer. Remove your dressing as told by your health care provider. In 24 hours Check your insertion site every day for signs of infection. Check for: Redness, swelling, or pain. Pus or a bad smell. Warmth. You may shower 24-48 hours after the procedure. Do not apply powder or lotion to the site.  Activity For 24 hours after the procedure, or as directed by your health care provider: Do not push or pull heavy objects with the affected arm. Do not drive yourself home from the hospital or clinic. You may drive 24 hours after the procedure unless your health care provider tells you not to. Do not lift anything that is heavier than 10 lb (4.5 kg), or the limit that you are told, until your health care provider says that it is safe.  For 24 hours Femoral Site Care The following information offers guidance on how to care for yourself after your procedure. Your health care provider may also give you more specific instructions. If you have problems or questions, contact your health care provider. What can I expect after the procedure? After the procedure, it is common to have bruising and tenderness at the incision site. This usually fades within 1-2 weeks. Follow these instructions at home: Incision site care  Follow instructions from your health care provider about how to take care of your incision site. Make sure you: Wash your hands with soap and water  for at least 20 seconds before and after you change your bandage (dressing). If soap and water   are not available, use hand sanitizer. Remove your dressing in 24 hours. Leave stitches (sutures), skin glue, or adhesive strips in place. These skin closures may need to stay in place for 2 weeks or longer. If adhesive strip edges start to loosen and curl up, you may  trim the loose edges. Do not remove adhesive strips completely unless your health care provider tells you to do that. Do not take baths, swim, or use a hot tub for at least 1 week. You may shower 24 hours after the procedure or as told by your health care provider. Gently wash the incision site with plain soap and water . Pat the area dry with a clean towel. Do not rub the site. This may cause bleeding. Do not apply powder or lotion to the site. Keep the site clean and dry. Check your femoral site every day for signs of infection. Check for: Redness, swelling, or pain. Fluid or blood. Warmth. Pus or a bad smell. Activity If you were given a sedative during the procedure, it can affect you for several hours. Do not drive or operate machinery until your health care provider says that it is safe. Rest as told by your health care provider. Avoid sitting for a long time without moving. Get up to take short walks every 1-2 hours. This is important to improve blood flow and breathing. Ask for help if you feel weak or unsteady. Return to your normal activities as told by your health care provider. Ask your health care provider what activities are safe for you and when you can return to work. Avoid activities that take a lot of effort for the first 2-3 days after your procedure, or as long as directed. Do not lift anything that is heavier than 10 lb (4.5 kg), or the limit that you are told, until your health care provider says that it is safe. General instructions Take over-the-counter and prescription medicines only as told by your health care provider. If you will be going home right after the procedure, plan to have a responsible adult care for you for the time you are told. This is important. Keep all follow-up visits. This is important. Contact a health care provider if: You have a fever or chills. You have any of these signs of infection at your incision site: Redness, swelling, or pain. Fluid  or blood. Warmth. Pus or a bad smell. Get help right away if: The incision area swells very fast. The incision area is bleeding, and the bleeding does not stop when you hold steady pressure on the area. Your leg or foot becomes pale, cool, tingly, or numb. These symptoms may represent a serious problem that is an emergency. Do not wait to see if the symptoms will go away. Get medical help right away. Call your local emergency services (911 in the [place] Do not drive yourself to the hospital. Summary After the procedure, it is common to have bruising and tenderness that fade within 1-2 weeks. Check your femoral site every day for signs of infection. Do not lift anything that is heavier than 10 lb (4.5 kg), or the limit that you are told, until your health care provider says that it is safe. Get help right away if the incision area swells very fast, you have bleeding at the incision area that does not stop, or your leg or foot becomes pale, cool, or numb. This information is not intended to replace advice given to you by your health care provider. Make sure  you discuss any questions you have with your health care provider. Document Revised: 11/05/2020 Document Reviewed: 04/06/2020 Elsevier Patient Education  2024 [employer]Radial Site Care The following information offers guidance on how to care for yourself after your procedure. Your health care provider may also give you more specific instructions. If you have problems or questions, contact your health care provider. What can I expect after the procedure? After the procedure, it is common to have bruising and tenderness in the incision area. Follow these instructions at home: Incision site care  Follow instructions from your health care provider about how to take care of your incision site. Make sure you: Wash your hands with soap and water  for at least 20 seconds before and after you change your bandage (dressing). If soap and water  are  not available, use hand sanitizer. Remove your dressing in 24 hours. Leave stitches (sutures), skin glue, or adhesive strips in place. These skin closures may need to stay in place for 2 weeks or longer. If adhesive strip edges start to loosen and curl up, you may trim the loose edges. Do not remove adhesive strips completely unless your health care provider tells you to do that. Do not take baths, swim, or use a hot tub for at least 1 week. You may shower 24 hours after the procedure or as told by your health care provider. Remove the dressing and gently wash the incision area with plain soap and water . Pat the area dry with a clean towel. Do not rub the site. That could cause bleeding. Do not apply powder or lotion to the site. Check your incision site every day for signs of infection. Check for: Redness, swelling, or pain. Fluid or blood. Warmth. Pus or a bad smell. Activity For 24 hours after the procedure, or as directed by your health care provider: Do not flex or bend the affected arm. Do not push or pull heavy objects with the affected arm. Do not operate machinery or power tools. Do not drive. You should not drive yourself home from the hospital or clinic if you go home during that time period. You may drive 24 hours after the procedure unless your health care provider tells you not to. Do not lift anything that is heavier than 10 lb (4.5 kg), or the limit that you are told, until your health care provider says that it is safe. Return to your normal activities as told by your health care provider. Ask your health care provider what activities are safe for you and when you can return to work. If you were given a sedative during the procedure, it can affect you for several hours. Do not drive or operate machinery until your health care provider says that it is safe. General instructions Take over-the-counter and prescription medicines only as told by your health care provider. If you  will be going home right after the procedure, plan to have a responsible adult care for you for the time you are told. This is important. Keep all follow-up visits. This is important. Contact a health care provider if: You have a fever or chills. You have any of these signs of infection at your incision site: Redness, swelling, or pain. Fluid or blood. Warmth. Pus or a bad smell. Get help right away if: The incision area swells very fast. The incision area is bleeding, and the bleeding does not stop when you hold steady pressure on the area. Your arm or hand becomes pale, cool, tingly, or numb.  These symptoms may represent a serious problem that is an emergency. Do not wait to see if the symptoms will go away. Get medical help right away. Call your local emergency services (911 in the [place] Do not drive yourself to the hospital. Summary After the procedure, it is common to have bruising and tenderness at the incision site. Follow instructions from your health care provider about how to take care of your radial site incision. Check the incision every day for signs of infection. Do not lift anything that is heavier than 10 lb (4.5 kg), or the limit that you are told, until your health care provider says that it is safe. Get help right away if the incision area swells very fast, you have bleeding at the incision site that will not stop, or your arm or hand becomes pale, cool, or numb. This information is not intended to replace advice given to you by your health care provider. Make sure you discuss any questions you have with your health care provider. Document Revised: 04/06/2020 Document Reviewed: 04/06/2020 Elsevier Patient Education  2024 [employer].

## 2024-02-21 NOTE — Discharge Summary (Signed)
 "   Discharge Summary for Same Day PCI   Patient ID: Bruce Sanchez MRN: [id number]; DOB: May 02, 1955  Admit date: 02/21/2024 Discharge date: 02/21/2024  Primary Care Provider: Joshua Debby CROME, MD  Primary Cardiologist: Lynwood Schilling, MD  Primary Electrophysiologist:  None   Discharge Diagnoses    Active Problems:   Coronary artery disease of native artery of native heart with stable angina pectoris   Diagnostic Studies/Procedures    Coronary angiography 02/21/2024: LM: Normal LAD: Diffuse moderate disease with severe calcification Lcx: Ostial-proximal severely calcific 80% stenosis.         Pre-PCI RFR 0.86, post-PCI RFR 1.0 RCA: Medium size vessel, dominant           Mild diffuse disease           Distal focal calcific, eccentric 70% stenosis, virtual FFR 0.81     LVEDP 21 mmHg   Right and left heart catheterization 02/21/2024: RA: 8 mmHg RV: 50.3 mmHg PA: 41/15 mmHg, mPAP 25 mmHg PCW: 14 mmHg   AO sats: 99% PA sats: 76%   CO: 6.4 L/min CI: 3.2 L/min/m2   Aortic valve hemodynamics: Mean PG 27 mmHg, AVA 1.09 cm2   Coronary intervention 02/21/2024: Successful percutaneous coronary intervention prox Lcx        Intravascular ultrasound (IVUS)        PTCA and stent placement 3.5 X 16 mm Synergy drug-eluting stent, deployed at 12 atm        Postdilatation using 3.75 x 12 mm [place] balloon up to 18 atm        0% residual stenosis at the lesion.  Pre-PCI RFR 0.86--> post-PCI RFR 1.0        Conclusion: One-vessel obstructive coronary artery disease (ostial-proximal left circumflex) Rest mild to moderate coronary artery disease Moderate aortic stenosis Mild pulmonary hypertension, WHO group 2 Successful complex PCI to ostial left proximal left circumflex   Recommendations: DAPT with aspirin  and Plavix  for 6 months, then monotherapy with either aspirin  or Plavix . Surveillance echocardiographic monitoring of aortic stenosis   Unusually delayed procedure time due to  performance of simultaneous LV-AO measurement using dual-lumen Langston catheter, complex coronary intervention due to severe tortuosity and calcified vessel requiring use of multiple wires, multiple balloons, as described.   Newman JINNY Lawrence, MD _____________   History of Present Illness     Bruce Sanchez is a 68 y.o. male with hypertension, hyperlipidemia, type 2 diabetes mellitus, ulcerative colitis, aortic stenosis, coronary artery disease  who was recently seen in the office after having CCTa which was concerning for severe aortic stenosis and sent to the ED for chest pain and shortness of breath. He was seen by Dr. Lawrence in the hospital in consultation with recommendations for echo and outpatient R/LHC. Echo showed LVEF of 60-65%, g1DD, moderate AS. Give the discrepancies between echo and CT he was set up for outpatient cardiac cath.   Hospital Course     The patient underwent cardiac cath as noted above with severely calcific 80% oLCx treated with IVUS guided PCI/DES x1. Plan for DAPT with ASA/plavix  for at least 6 months. The patient was seen by cardiac rehab while in short stay. There were no observed complications post cath. Bruce Sanchez (arterial/femoral (venous) cath site was re-evaluated prior to discharge and found to be stable without any complications. Instructions/precautions regarding cath site care were given prior to discharge.  Trinity Faw was seen by Dr. Lawrence and determined stable for discharge home. Follow up with our office has been  arranged. Medications are listed below. Pertinent changes include plavix . _____________  Cath/PCI Registry Performance & Quality Measures: Aspirin  prescribed? - Yes ADP Receptor Inhibitor (Plavix /Clopidogrel , Brilinta/Ticagrelor or Effient/Prasugrel) prescribed (includes medically managed patients)? - Yes High Intensity Statin (Lipitor 40-80mg  or Crestor  20-40mg ) prescribed? - Yes For EF <40%, was ACEI/ARB prescribed? - Not Applicable  (EF >/= 40%) For EF <40%, Aldosterone Antagonist (Spironolactone or Eplerenone) prescribed? - Not Applicable (EF >/= 40%) Cardiac Rehab Phase II ordered (Included Medically managed Patients)? - Yes  _____________   Discharge Vitals Blood pressure (!) 143/78, pulse 75, temperature 97.6 F (36.4 C), temperature source Oral, resp. rate 19, height 5' 10 (1.778 m), weight 85.3 kg, SpO2 93%.  Filed Weights   02/21/24 1020  Weight: 85.3 kg    Last Labs & Radiologic Studies    CBC No results for input(s): WBC, NEUTROABS, HGB, HCT, MCV, PLT in the last 72 hours. Basic Metabolic Panel No results for input(s): NA, K, CL, CO2, GLUCOSE, BUN, CREATININE, CALCIUM , MG, PHOS in the last 72 hours. Liver Function Tests No results for input(s): AST, ALT, ALKPHOS, BILITOT, PROT, ALBUMIN in the last 72 hours. No results for input(s): LIPASE, AMYLASE in the last 72 hours. High Sensitivity Troponin:   Recent Labs  Lab 02/10/24 1704 02/10/24 2009  TROPONINIHS 4 4    BNP Invalid input(s): POCBNP D-Dimer No results for input(s): DDIMER in the last 72 hours. Hemoglobin A1C No results for input(s): HGBA1C in the last 72 hours. Fasting Lipid Panel No results for input(s): CHOL, HDL, LDLCALC, TRIG, CHOLHDL, LDLDIRECT in the last 72 hours. Thyroid  Function Tests No results for input(s): TSH, T4TOTAL, T3FREE, THYROIDAB in the last 72 hours.  Invalid input(s): FREET3 _____________  CARDIAC CATHETERIZATION Result Date: 02/21/2024 Images from the original result were not included.   Recommend uninterrupted dual antiplatelet therapy with Aspirin  81mg  daily and Clopidogrel  75mg  daily for a minimum of 6 months (stable ischemic heart disease-Class I recommendation). Coronary angiography 02/21/2024: LM: Normal LAD: Diffuse moderate disease with severe calcification Lcx: Ostial-proximal severely calcific 80% stenosis.         Pre-PCI  RFR 0.86, post-PCI RFR 1.0 RCA: Medium size vessel, dominant           Mild diffuse disease           Distal focal calcific, eccentric 70% stenosis, virtual FFR 0.81 LVEDP 21 mmHg Right and left heart catheterization 02/21/2024: RA: 8 mmHg RV: 50.3 mmHg PA: 41/15 mmHg, mPAP 25 mmHg PCW: 14 mmHg AO sats: 99% PA sats: 76% CO: 6.4 L/min CI: 3.2 L/min/m2 Aortic valve hemodynamics: Mean PG 27 mmHg, AVA 1.09 cm2 Coronary intervention 02/21/2024: Successful percutaneous coronary intervention prox Lcx        Intravascular ultrasound (IVUS)        PTCA and stent placement 3.5 X 16 mm Synergy drug-eluting stent, deployed at 12 atm        Postdilatation using 3.75 x 12 mm [place] balloon up to 18 atm        0% residual stenosis at the lesion.  Pre-PCI RFR 0.86--> post-PCI RFR 1.0 Conclusion: One-vessel obstructive coronary artery disease (ostial-proximal left circumflex) Rest mild to moderate coronary artery disease Moderate aortic stenosis Mild pulmonary hypertension, WHO group 2 Successful complex PCI to ostial left proximal left circumflex Recommendations: DAPT with aspirin  and Plavix  for 6 months, then monotherapy with either aspirin  or Plavix . Surveillance echocardiographic monitoring of aortic stenosis Unusually delayed procedure time due to performance of simultaneous LV-AO measurement using dual-lumen [place]  catheter, complex coronary intervention due to severe tortuosity and calcified vessel requiring use of multiple wires, multiple balloons, as described. Newman JINNY Lawrence, MD   US  Renal Artery Stenosis Result Date: 02/15/2024 CLINICAL DATA:  Medically refractory hypertension. Assess for renal artery stenosis. EXAM: RENAL/URINARY TRACT ULTRASOUND RENAL DUPLEX DOPPLER ULTRASOUND COMPARISON:  None Available. FINDINGS: Right Kidney: Length: 12.2 x 6.8 x 5.6 cm for a total volume of 240 mL. Echogenicity within normal limits. No mass or hydronephrosis visualized. Left Kidney: Length: 12.6 x 5.9 x 5.6 cm for a total  volume of 208 mL. Echogenicity within normal limits. No mass or hydronephrosis visualized. Bladder:  Within normal limits. RENAL DUPLEX ULTRASOUND Right Renal Artery Velocities: Origin:  152 cm/sec Mid:  115 cm/sec Hilum:   cm/sec Interlobar:  117 63 cm/sec Arcuate:  35 cm/sec Left Renal Artery Velocities: Origin:  133 cm/sec Mid:  136 cm/sec Hilum:  73 cm/sec Interlobar:  41 cm/sec Arcuate:  23 cm/sec Aortic Velocity:  133 cm/sec Right Renal-Aortic Ratios: Origin: 1.1 Mid:  0.9 Hilum: 0.9 Interlobar: 0.5 Arcuate: 0.3 Left Renal-Aortic Ratios: Origin: 1 Mid: 1 Hilum: 0.6 Interlobar: 0.3 Arcuate: 0.2 IMPRESSION: 1. No evidence of hemodynamically significant renal artery stenosis. 2. Normal sonographic appearance of the kidneys. Electronically Signed   By: Wilkie Lent M.D.   On: 02/15/2024 13:06   ECHOCARDIOGRAM COMPLETE Result Date: 02/11/2024    ECHOCARDIOGRAM REPORT   Patient Name:   Bruce Sanchez Date of Exam: 02/11/2024 Medical Rec #:  [id number]     Height:       70.0 in Accession #:    [id number]    Weight:       188.0 lb Date of Birth:  02/25/1956     BSA:          2.033 m Patient Age:    68 years      BP:           137/72 mmHg Patient Gender: M             HR:           82 bpm. Exam Location:  Inpatient Procedure: 2D Echo (Both Spectral and Color Flow Doppler were utilized during            procedure). Indications:    aortic stenosis  History:        Patient has prior history of Echocardiogram examinations, most                 recent 06/02/2021. CAD, Signs/Symptoms:Murmur; Risk                 Factors:Hypertension, Diabetes and Dyslipidemia.  Sonographer:    Tinnie Barefoot RDCS Referring Phys: [id number] [facility] J PATWARDHAN  Sonographer Comments: Suboptimal subcostal window. IMPRESSIONS  1. Left ventricular ejection fraction, by estimation, is 60 to 65%. The left ventricle has normal function. The left ventricle has no regional wall motion abnormalities. There is mild left ventricular hypertrophy.  Left ventricular diastolic parameters are consistent with Grade I diastolic dysfunction (impaired relaxation).  2. Right ventricular systolic function is normal. The right ventricular size is normal.  3. The mitral valve is normal in structure. No evidence of mitral valve regurgitation. No evidence of mitral stenosis.  4. The aortic valve is calcified. Aortic valve regurgitation is not visualized. Moderate aortic valve stenosis. FINDINGS  Left Ventricle: Left ventricular ejection fraction, by estimation, is 60 to 65%. The left ventricle has normal function. The left ventricle has no regional wall  motion abnormalities. The left ventricular internal cavity size was normal in size. There is  mild left ventricular hypertrophy. Left ventricular diastolic parameters are consistent with Grade I diastolic dysfunction (impaired relaxation). Right Ventricle: The right ventricular size is normal. Right ventricular systolic function is normal. Left Atrium: Left atrial size was normal in size. Right Atrium: Right atrial size was normal in size. Pericardium: There is no evidence of pericardial effusion. Mitral Valve: The mitral valve is normal in structure. There is mild calcification of the mitral valve leaflet(s). No evidence of mitral valve regurgitation. No evidence of mitral valve stenosis. Tricuspid Valve: The tricuspid valve is not well visualized. Tricuspid valve regurgitation is trivial. No evidence of tricuspid stenosis. Aortic Valve: The aortic valve is calcified. Aortic valve regurgitation is not visualized. Moderate aortic stenosis is present. Aortic valve mean gradient measures 21.0 mmHg. Aortic valve peak gradient measures 36.2 mmHg. Aortic valve area, by VTI measures 1.08 cm. Pulmonic Valve: The pulmonic valve was normal in structure. Pulmonic valve regurgitation is not visualized. No evidence of pulmonic stenosis. Aorta: The aortic root is normal in size and structure. Venous: The inferior vena cava was not well  visualized. IAS/Shunts: The interatrial septum was not well visualized.  LEFT VENTRICLE PLAX 2D LVIDd:         4.30 cm     Diastology LVIDs:         2.50 cm     LV e' medial:    6.96 cm/s LV PW:         1.10 cm     LV E/e' medial:  10.1 LV IVS:        1.40 cm     LV e' lateral:   8.70 cm/s LVOT diam:     2.20 cm     LV E/e' lateral: 8.1 LV SV:         60 LV SV Index:   30 LVOT Area:     3.80 cm LV IVRT:       39 msec  LV Volumes (MOD) LV vol d, MOD A2C: 49.8 ml LV vol d, MOD A4C: 62.2 ml LV vol s, MOD A2C: 16.2 ml LV vol s, MOD A4C: 27.4 ml LV SV MOD A2C:     33.6 ml LV SV MOD A4C:     62.2 ml LV SV MOD BP:      34.2 ml RIGHT VENTRICLE RV Basal diam:  2.70 cm RV S prime:     15.00 cm/s TAPSE (M-mode): 1.7 cm LEFT ATRIUM             Index        RIGHT ATRIUM           Index LA diam:        3.20 cm 1.57 cm/m   RA Area:     14.60 cm LA Vol (A2C):   32.3 ml 15.89 ml/m  RA Volume:   29.30 ml  14.41 ml/m LA Vol (A4C):   57.8 ml 28.43 ml/m LA Biplane Vol: 47.8 ml 23.51 ml/m  AORTIC VALVE AV Area (Vmax):    1.14 cm AV Area (Vmean):   1.05 cm AV Area (VTI):     1.08 cm AV Vmax:           301.00 cm/s AV Vmean:          219.000 cm/s AV VTI:            0.559 m AV Peak Grad:  36.2 mmHg AV Mean Grad:      21.0 mmHg LVOT Vmax:         90.00 cm/s LVOT Vmean:        60.250 cm/s LVOT VTI:          0.159 m LVOT/AV VTI ratio: 0.28  AORTA Ao Root diam: 3.70 cm Ao Asc diam:  3.50 cm MITRAL VALVE MV Area (PHT): 3.93 cm    SHUNTS MV Decel Time: 193 msec    Systemic VTI:  0.16 m MV E velocity: 70.40 cm/s  Systemic Diam: 2.20 cm MV A velocity: 94.00 cm/s MV E/A ratio:  0.75 Redell Shallow MD Electronically signed by Redell Shallow MD Signature Date/Time: 02/11/2024/11:29:14 AM    Final    DG Chest 2 View Result Date: 02/10/2024 CLINICAL DATA:  Shortness of breath EXAM: CHEST - 2 VIEW COMPARISON:  CT 02/15/2023 FINDINGS: No acute airspace disease or pleural effusion. Minimal atelectasis at the lingula. Aortic  atherosclerosis. Diffuse ankylosis of the spine. IMPRESSION: No active cardiopulmonary disease. Minimal atelectasis at the lingula. Electronically Signed   By: Luke Bun M.D.   On: 02/10/2024 19:02   CT CORONARY MORPH W/CTA COR W/SCORE W/CA W/CM &/OR WO/CM Addendum Date: 02/08/2024 ADDENDUM REPORT: 02/08/2024 17:41 EXAM: OVER-READ INTERPRETATION  CT CHEST The following report is an over-read performed by radiologist Dr. Ozell Medal [facility] on 02/08/2024. This over-read does not include interpretation of cardiac or coronary anatomy or pathology. The coronary CTA interpretation by the cardiologist is attached. COMPARISON:  02/15/2023 FINDINGS: Mediastinum: No pathologic adenopathy. Visualized portions of the tracheobronchial tree and esophagus are unremarkable. Lungs: No acute airspace disease, effusion, or pneumothorax. Central airways are patent. Abdomen: No acute findings. Musculoskeletal: No acute or destructive bony abnormalities. Reconstructed images demonstrate no additional findings. IMPRESSION: 1. No acute or significant noncardiac findings. Electronically Signed   By: Ozell Daring M.D.   On: 02/08/2024 17:41   Result Date: 02/08/2024 HISTORY: Chest pain/anginal equiv, high CAD risk, not treadmill candidate EXAM: Cardiac/Coronary  CT PROTOCOL: A non-contrast, gated CT scan was obtained with axial slices of 2.5 mm through the heart for calcium  scoring. Calcium  scoring was performed using the Agatston method. A 120 kV prospective, gated, contrast cardiac CT scan was obtained. Gantry rotation speed was 230 msec and collimation was 0.63 mm. Two sublingual nitroglycerin  tablets (0.8 mg) were given. The 3D data set was reconstructed with motion correction for the best systolic or diastolic phase. Images were analyzed on a dedicated workstation using MPR, MIP, and VRT modes. The patient received 95 cc of contrast. FINDINGS: Image quality: Average. Artifact: Limited - blooming  artifact. Coronary calcium  score is 5,585, which places the patient in the 99th percentile for age and sex matched control. Coronary arteries: Normal coronary origins.  Right dominance. Left Main Coronary Artery: Trifurcates into left anterior descending artery (LAD), left circumflex artery (LCX), and ramus intermedius (RI). Mild (25-49%) stenosis due to calcified plaque at distal left main. Left Anterior Descending Artery: Normal caliber vessel, wrap the apex, gives off 1 diagonal branch. Long segment of mild stenosis (25-49%) due mixed (predominantly calcified) plaque in proximal LAD. Moderate stenosis (50-69%) due to calcified plaque at mid LAD just distal to D1. Diffuse disease with tandem lesions in the mid to distal LAD due to mixed plaque likely moderate plaque burden (50-69%). Accuray is limited due to dense calcification causing blooming artifact and smaller caliber vessel distally. First Diagonal branch: Mild (25-49% plaque at ostium, moderate stenosis (50-69%) due to  soft plaque at mid D1, otherwise patent Ramus intermedius: Mild (25-49%) soft plaque at the ostium, moderate stenosis (50-69%) due to soft plaque at mid segment, otherwise patent. Left Circumflex Artery: Mild (25-49%) diffuse disease predominantly calcified plaque at ostium, proximal, and mid segments. Distal segment tapers off with luminal irregularities due to calcified plaque. First Obtuse Marginal branch: Patent Second Obtuse Marginal branch: Patent Third Obtuse Marginal branch: Patent Right Coronary Artery: Diffuse disease. Moderate stenosis (50-69%) at proximal RCA due to calcified plaque, mild stenosis (25-49%) at mid RCA due to calcified plaque, diffuse mixed plaque within the distal RCA. Mild stenosis (25-49%) at proximal PAD due to calcified plaque. Aorta: Normal size, 38 mm at the mid ascending aorta (level of the PA bifurcation) measured double oblique. Aortic atherosclerosis. Aortic Valve: Native valve, trileaflet aortic valve,  valvular calcification. Aortic valve calcium  score 2,772 suggestive of severe aortic stenosis. Mitral valve: Native valve, no significant mitral annular calcification. Other findings: Normal pulmonary vein drainage into the left atrium. Normal left atrial appendage without thrombus. Mild dilatation of main pulmonary artery, 31 mm, may indicate increased pulmonary pressures. Patent foramen ovale. Please see separate report from [facility] for non-cardiac findings. IMPRESSION: 1. Coronary calcium  score of 5,585. This was 99th percentile for age and sex matched control. 2.  Normal coronary origins with right dominance. 3.  CAD-RADS 3 Moderate Coronary artery disease. 4. Mild (25-49%) stenosis due to calcified plaque at distal left main. 5. Moderate stenosis (50-69%) due to calcified plaque at mid LAD just distal to D1. Diffuse disease with tandem lesions in the mid to distal LAD due to mixed plaque likely moderate plaque burden (50-69%). Accuray is limited due to dense calcification causing blooming artifact and smaller vessel caliber distally. 6.  Moderate stenosis (50-69%) due to soft plaque at mid Ramus. 7. Mild (25-49%) diffuse disease predominantly calcified plaque at ostium, proximal, and mid LCx. 8. Moderate stenosis (50-69%) at proximal RCA due to calcified plaque. 9. Aortic valve calcium  score 2,772 suggestive of severe aortic stenosis, correlate with echocardiography. 10. Aortic atherosclerosis. 11. Mild dilatation of main pulmonary artery, 31 mm, may indicate increased pulmonary pressures. 12. Patent foramen ovale. 13. Due to study limitations CT FFR data is not available at this time. We resubmitted the dataset to [facility] but it was not processed. RECOMMENDATION: Consider symptom-guided anti-ischemic pharmacotherapy as well as risk factor modification per guideline directed care. Cardiology consultation recommended. Consider either invasive angiography or different imaging modality to evaluate  for ischemia, clinical correlation is required. Electronically Signed: By: Madonna Large On: 02/08/2024 17:36    Disposition   Pt is being discharged home today in good condition.  Follow-up Plans & Appointments     Discharge Instructions     AMB Referral to Cardiac Rehabilitation - Phase II   Complete by: As directed    Diagnosis: Coronary Stents   After initial evaluation and assessments completed: Virtual Based Care may be provided alone or in conjunction with Phase 2 Cardiac Rehab based on patient barriers.: Yes   Intensive Cardiac Rehabilitation (ICR) [facility] location only OR Traditional Cardiac Rehabilitation (TCR) *If criteria for ICR are not met will enroll in TCR [facility] only): Yes        Discharge Medications   Allergies as of 02/21/2024       Reactions   Metformin  And Related Diarrhea   Trulicity [dulaglutide] Nausea And Vomiting   Synjardy  [empagliflozin -metformin  Hcl] Diarrhea, Nausea Only   Olmesartan  Rash        Medication  List     TAKE these medications    amLODipine  5 MG tablet Commonly known as: NORVASC  Take 1 tablet (5 mg total) by mouth daily.   aspirin  EC 81 MG tablet Take 1 tablet (81 mg total) by mouth daily. Swallow whole. What changed:  when to take this reasons to take this   Blood Glucose Monitoring Suppl Devi 1 each by Does not apply route in the morning, at noon, and at bedtime. May substitute to any manufacturer covered by patient's insurance.   BLOOD GLUCOSE TEST STRIPS Strp Use to check blood glucose up to 3 times daily. May substitute to any manufacturer covered by patient's insurance.   clopidogrel  75 MG tablet Commonly known as: PLAVIX  Take 1 tablet (75 mg total) by mouth daily with breakfast. Start taking on: February 22, 2024   hydrALAZINE  25 MG tablet Commonly known as: APRESOLINE  Take 1 tablet (25 mg total) by mouth 3 (three) times daily.   hydrocortisone  2.5 % rectal cream Commonly known as: ANUSOL -HC Place 1  Application rectally daily as needed for hemorrhoids or anal itching.   indapamide  1.25 MG tablet Commonly known as: LOZOL  Take 1 tablet (1.25 mg total) by mouth daily.   isosorbide  mononitrate 30 MG 24 hr tablet Commonly known as: IMDUR  Take 1 tablet (30 mg total) by mouth daily.   Lancets Misc. Misc Use to check blood glucose up to 3 times daily. May substitute to any manufacturer covered by patient's insurance.   metFORMIN  750 MG 24 hr tablet Commonly known as: GLUCOPHAGE -XR Take 1 tablet (750 mg total) by mouth 2 (two) times daily.   metoprolol  succinate 50 MG 24 hr tablet Commonly known as: TOPROL -XL TAKE 1 TABLET BY MOUTH DAILY WITH OR IMMEDIATELY AFTER A MEAL   nitroGLYCERIN  0.4 MG SL tablet Commonly known as: NITROSTAT  Place 1 tablet (0.4 mg total) under the tongue every 5 (five) minutes as needed for chest pain.   rosuvastatin  20 MG tablet Commonly known as: CRESTOR  Take 1 tablet (20 mg total) by mouth daily.   sulfaSALAzine  500 MG tablet Commonly known as: AZULFIDINE  Take 2 tablets (1,000 mg total) by mouth 3 (three) times daily.   TechLite Pen Needles 32G X 6 MM Misc Generic drug: Insulin  Pen Needle Use to inject insulin  once daily.   Toujeo  Max SoloStar 300 UNIT/ML Solostar Pen Generic drug: insulin  glargine (2 Unit Dial ) Inject 20 Units into the skin every evening.           Allergies Allergies[1]  Outstanding Labs/Studies   N/a   Duration of Discharge Encounter   Greater than 30 minutes including physician time.  Signed, Manuelita Rummer, NP 02/21/2024, 4:48 PM     [1]  Allergies Allergen Reactions   Metformin  And Related Diarrhea   Trulicity [Dulaglutide] Nausea And Vomiting   Synjardy  [Empagliflozin -Metformin  Hcl] Diarrhea and Nausea Only   Olmesartan  Rash   "

## 2024-02-21 NOTE — Interval H&P Note (Signed)
 History and Physical Interval Note:  02/21/2024 11:31 AM  Bruce Sanchez  has presented today for surgery, with the diagnosis of AS.  The various methods of treatment have been discussed with the patient and family. After consideration of risks, benefits and other options for treatment, the patient has consented to  Procedures: RIGHT/LEFT HEART CATH AND CORONARY ANGIOGRAPHY (N/A) as a surgical intervention.  The patient's history has been reviewed, patient examined, no change in status, stable for surgery.  I have reviewed the patient's chart and labs.  Questions were answered to the patient's satisfaction.     [name] [name]

## 2024-02-21 NOTE — H&P (Signed)
 "   Cardiology Admission History and Physical   Patient ID: Bruce Sanchez MRN: [id number]; DOB: 1955-07-17   Admission date: 02/21/2024  PCP:  Joshua Debby CROME, MD   [facility] Providers Cardiologist:  Lynwood Schilling, MD        Chief Complaint:  Exertional dyspnea, chest pain   History of Present Illness:   Bruce Sanchez is a 68 y/o male w/hypertension, hyperlipidemia, type 2 diabetes mellitus, ulcerative colitis, aortic stenosis, coronary artery disease   At least moderate AS and obstructive CAD Symptoms of exertional chest pain and dyspnea   Past Medical History:  Diagnosis Date   Arthritis    Cataract    Colitis    Diabetes mellitus without complication (HCC)    Heart murmur    Hyperlipidemia    Hypertension    Thyroid  disease    Ulcerative colitis (HCC)     Past Surgical History:  Procedure Laterality Date   HERNIA REPAIR     WISDOM TOOTH EXTRACTION       Medications Prior to Admission: Prior to Admission medications  Medication Sig Start Date End Date Taking? Authorizing Provider  amLODipine  (NORVASC ) 5 MG tablet Take 1 tablet (5 mg total) by mouth daily. 02/11/24  Yes Lue Elsie BROCKS, MD  aspirin  EC 81 MG tablet Take 1 tablet (81 mg total) by mouth daily. Swallow whole. Patient taking differently: Take 81 mg by mouth daily as needed. Swallow whole. 02/10/24  Yes Tolia, [name], DO  Blood Glucose Monitoring Suppl DEVI 1 each by Does not apply route in the morning, at noon, and at bedtime. May substitute to any manufacturer covered by patient's insurance. 07/11/23  Yes Joshua Debby CROME, MD  Glucose Blood (BLOOD GLUCOSE TEST STRIPS) STRP Use to check blood glucose up to 3 times daily. May substitute to any manufacturer covered by patient's insurance. 07/11/23  Yes Joshua Debby CROME, MD  hydrALAZINE  (APRESOLINE ) 25 MG tablet Take 1 tablet (25 mg total) by mouth 3 (three) times daily. 01/12/24  Yes Joshua Debby CROME, MD  hydrocortisone  (ANUSOL -HC) 2.5 % rectal  cream Place 1 Application rectally daily as needed for hemorrhoids or anal itching. 10/05/23  Yes Cirigliano, Vito V, DO  indapamide  (LOZOL ) 1.25 MG tablet Take 1 tablet (1.25 mg total) by mouth daily. 01/12/24  Yes Joshua Debby CROME, MD  insulin  glargine, 2 Unit Dial , (TOUJEO  MAX SOLOSTAR) 300 UNIT/ML Solostar Pen Inject 20 Units into the skin every evening. 02/14/24  Yes Joshua Debby CROME, MD  Insulin  Pen Needle (DROPLET PEN NEEDLES) 32G X 6 MM MISC Use to inject insulin  once daily. 02/01/24  Yes Joshua Debby CROME, MD  Lancets Misc. MISC Use to check blood glucose up to 3 times daily. May substitute to any manufacturer covered by patient's insurance. 07/11/23  Yes Joshua Debby CROME, MD  metoprolol  succinate (TOPROL -XL) 50 MG 24 hr tablet TAKE 1 TABLET BY MOUTH DAILY WITH OR IMMEDIATELY AFTER A MEAL 01/09/24  Yes Joshua Debby CROME, MD  rosuvastatin  (CRESTOR ) 20 MG tablet Take 1 tablet (20 mg total) by mouth daily. 01/12/24  Yes Joshua Debby CROME, MD  sulfaSALAzine  (AZULFIDINE ) 500 MG tablet Take 2 tablets (1,000 mg total) by mouth 3 (three) times daily. 10/12/23  Yes Joshua Debby CROME, MD  isosorbide  mononitrate (IMDUR ) 30 MG 24 hr tablet Take 1 tablet (30 mg total) by mouth daily. 02/08/24   Joshua Debby CROME, MD  metFORMIN  (GLUCOPHAGE -XR) 750 MG 24 hr tablet Take 1 tablet (750 mg total) by mouth 2 (two)  times daily. 02/01/24   Joshua Debby CROME, MD  nitroGLYCERIN  (NITROSTAT ) 0.4 MG SL tablet Place 1 tablet (0.4 mg total) under the tongue every 5 (five) minutes as needed for chest pain. 02/10/24 05/10/24  Tolia, [name], DO     Allergies:   Allergies[1]  Social History:   Social History   Socioeconomic History   Marital status: Married    Spouse name: Not on file   Number of children: Not on file   Years of education: Not on file   Highest education level: Not on file  Occupational History   Occupation: chef  Tobacco Use   Smoking status: Never    Passive exposure: Never   Smokeless tobacco: Never  Vaping Use    Vaping status: Never Used  Substance and Sexual Activity   Alcohol use: Yes    Alcohol/week: 7.0 standard drinks of alcohol    Types: 7 Glasses of wine per week    Comment: occassionally   Drug use: Never   Sexual activity: Yes    Partners: Female  Other Topics Concern   Not on file  Social History Narrative   Married   Social Drivers of Health   Tobacco Use: Low Risk (02/10/2024)   Patient History    Smoking Tobacco Use: Never    Smokeless Tobacco Use: Never    Passive Exposure: Never  Financial Resource Strain: Low Risk (06/28/2023)   Overall Financial Resource Strain (CARDIA)    Difficulty of Paying Living Expenses: Not hard at all  Food Insecurity: No Food Insecurity (02/11/2024)   Epic    Worried About Programme Researcher, Broadcasting/film/video in the Last Year: Never true    Ran Out of Food in the Last Year: Never true  Transportation Needs: No Transportation Needs (02/11/2024)   Epic    Lack of Transportation (Medical): No    Lack of Transportation (Non-Medical): No  Physical Activity: Sufficiently Active (06/28/2023)   Exercise Vital Sign    Days of Exercise per Week: 7 days    Minutes of Exercise per Session: 60 min  Stress: No Stress Concern Present (06/28/2023)   [employer] of Occupational Health - Occupational Stress Questionnaire    Feeling of Stress : Not at all  Social Connections: Socially Isolated (02/11/2024)   Social Connection and Isolation Panel    Frequency of Communication with Friends and Family: Once a week    Frequency of Social Gatherings with Friends and Family: Once a week    Attends Religious Services: Never    Database Administrator or Organizations: No    Attends Banker Meetings: Never    Marital Status: Married  Catering Manager Violence: Not At Risk (06/28/2023)   Humiliation, Afraid, Rape, and Kick questionnaire    Fear of Current or Ex-Partner: No    Emotionally Abused: No    Physically Abused: No    Sexually Abused: No  Depression  (PHQ2-9): Low Risk (01/11/2024)   Depression (PHQ2-9)    PHQ-2 Score: 0  Alcohol Screen: Low Risk (06/28/2023)   Alcohol Screen    Last Alcohol Screening Score (AUDIT): 0  Housing: Low Risk (02/11/2024)   Epic    Unable to Pay for Housing in the Last Year: No    Number of Times Moved in the Last Year: 0    Homeless in the Last Year: No  Utilities: Not At Risk (02/11/2024)   Epic    Threatened with loss of utilities: No  Health Literacy: Adequate Health Literacy (  06/28/2023)   B1300 Health Literacy    Frequency of need for help with medical instructions: Never    Family History:   The patient's family history includes Arthritis in his mother; Diabetes in his brother and mother; Heart attack in his father and mother; Heart disease in his father. There is no history of Stomach cancer, Colon cancer, or Esophageal cancer.    ROS:  Please see the history of present illness.  All other ROS reviewed and negative.     Physical Exam/Data:   Vitals:   02/21/24 1020 02/21/24 1117  BP: (!) 142/60   Pulse: 85   Resp: 17   Temp: 97.6 F (36.4 C)   TempSrc: Oral   SpO2: 95% 94%  Weight: 85.3 kg   Height: 5' 10 (1.778 m)     Intake/Output Summary (Last 24 hours) at 02/21/2024 1127 Last data filed at 02/21/2024 1015 Gross per 24 hour  Intake 500 ml  Output --  Net 500 ml      02/21/2024   10:20 AM 02/10/2024    4:57 PM 02/10/2024    3:10 PM  Last 3 Weights  Weight (lbs) 188 lb 188 lb 188 lb  Weight (kg) 85.276 kg 85.276 kg 85.276 kg     Body mass index is 26.98 kg/m.   Physical Exam Vitals and nursing note reviewed.  Constitutional:      General: He is not in acute distress. Neck:     Vascular: No JVD.  Cardiovascular:     Rate and Rhythm: Normal rate and regular rhythm.     Heart sounds: Murmur heard.     Harsh midsystolic murmur is present with a grade of 3/6 at the upper right sternal border radiating to the neck.  Pulmonary:     Effort: Pulmonary effort is  normal.     Breath sounds: Normal breath sounds. No wheezing or rales.  Musculoskeletal:     Right lower leg: No edema.     Left lower leg: No edema.    EKG 02/10/2024: Sinus rhythm Normal EKG  Relevant CV Studies: Echocardiogram 02/11/2024:  1. Left ventricular ejection fraction, by estimation, is 60 to 65%. The  left ventricle has normal function. The left ventricle has no regional  wall motion abnormalities. There is mild left ventricular hypertrophy.  Left ventricular diastolic parameters  are consistent with Grade I diastolic dysfunction (impaired relaxation).   2. Right ventricular systolic function is normal. The right ventricular  size is normal.   3. The mitral valve is normal in structure. No evidence of mitral valve  regurgitation. No evidence of mitral stenosis.   4. The aortic valve is calcified. Aortic valve regurgitation is not  visualized. Moderate aortic valve stenosis.   Cor CTA 02/06/2024: 1. Coronary calcium  score of 5,585. This was 99th percentile for age and sex matched control.   2.  Normal coronary origins with right dominance.   3.  CAD-RADS 3 Moderate Coronary artery disease.   4. Mild (25-49%) stenosis due to calcified plaque at distal left main.   5. Moderate stenosis (50-69%) due to calcified plaque at mid LAD just distal to D1. Diffuse disease with tandem lesions in the mid to distal LAD due to mixed plaque likely moderate plaque burden (50-69%). Accuray is limited due to dense calcification causing blooming artifact and smaller vessel caliber distally.   6.  Moderate stenosis (50-69%) due to soft plaque at mid Ramus.   7. Mild (25-49%) diffuse disease predominantly calcified plaque at  ostium, proximal, and mid LCx.   8. Moderate stenosis (50-69%) at proximal RCA due to calcified plaque.   9. Aortic valve calcium  score 2,772 suggestive of severe aortic stenosis, correlate with echocardiography.   10. Aortic atherosclerosis.   11.  Mild dilatation of main pulmonary artery, 31 mm, may indicate increased pulmonary pressures.   12. Patent foramen ovale.   13. Due to study limitations CT FFR data is not available at this time. We resubmitted the dataset to [facility] but it was not processed. Laboratory Data:  High Sensitivity Troponin:   Recent Labs  Lab 02/10/24 1704 02/10/24 2009  TROPONINIHS 4 4      ChemistryNo results for input(s): NA, K, CL, CO2, GLUCOSE, BUN, CREATININE, CALCIUM , MG, GFRNONAA, GFRAA, ANIONGAP in the last 168 hours.  No results for input(s): PROT, ALBUMIN, AST, ALT, ALKPHOS, BILITOT in the last 168 hours. Lipids No results for input(s): CHOL, TRIG, HDL, LABVLDL, LDLCALC, CHOLHDL in the last 168 hours. HematologyNo results for input(s): WBC, RBC, HGB, HCT, MCV, MCH, MCHC, RDW, PLT in the last 168 hours. Thyroid  No results for input(s): TSH, FREET4 in the last 168 hours. BNPNo results for input(s): BNP, PROBNP in the last 168 hours.  DDimer No results for input(s): DDIMER in the last 168 hours.   Radiology/Studies:  No results found.   Assessment and Plan:   68 y/o male w/hypertension, hyperlipidemia, type 2 diabetes mellitus, ulcerative colitis, aortic stenosis, coronary artery disease   At least moderate AS and obstructive CAD Symptoms of exertional chest pain and dyspnea  Given discrepant findings between echocardiogram and CTA (regarding AS severity) and symptoms, recommend left and right heart catheterization, coronary angiography, possible intervention. Will perform simultaneous LV-Ao measurement.   For questions or updates, please contact [facility] Please consult [email or web address] for contact info under     Signed, Newman JINNY Lawrence, MD 02/21/2024 11:27 AM      [1]  Allergies Allergen Reactions   Metformin  And Related Diarrhea   Trulicity [Dulaglutide] Nausea And Vomiting    Synjardy  [Empagliflozin -Metformin  Hcl] Diarrhea and Nausea Only   Olmesartan  Rash   "

## 2024-02-22 ENCOUNTER — Telehealth (HOSPITAL_COMMUNITY): Payer: Self-pay

## 2024-02-22 ENCOUNTER — Encounter (HOSPITAL_COMMUNITY): Payer: Self-pay | Admitting: Cardiology

## 2024-02-22 NOTE — Telephone Encounter (Signed)
Called patient to see if he is interested in the Cardiac Rehab Program. Patient expressed interest. Explained scheduling process and went over insurance, patient verbalized understanding. Will contact patient for scheduling once he has been reviewed.

## 2024-02-25 ENCOUNTER — Other Ambulatory Visit: Payer: Self-pay | Admitting: Internal Medicine

## 2024-02-25 DIAGNOSIS — E1129 Type 2 diabetes mellitus with other diabetic kidney complication: Secondary | ICD-10-CM

## 2024-02-28 ENCOUNTER — Telehealth (HOSPITAL_COMMUNITY): Payer: Self-pay

## 2024-02-28 DIAGNOSIS — Z955 Presence of coronary angioplasty implant and graft: Secondary | ICD-10-CM

## 2024-02-28 NOTE — Telephone Encounter (Addendum)
 Patient present but primarily spoke with patient's wife about receiving Stent education materials. Reviewed materials and all questions and concerns addressed. Will refer to [facility] for CRP2.   [id number] Isaiah JAYSON Liverpool, RN BSN 02/28/2024 12:43 PM

## 2024-03-02 ENCOUNTER — Telehealth: Payer: Self-pay | Admitting: Cardiology

## 2024-03-02 ENCOUNTER — Other Ambulatory Visit: Payer: Self-pay | Admitting: Internal Medicine

## 2024-03-02 DIAGNOSIS — I25118 Atherosclerotic heart disease of native coronary artery with other forms of angina pectoris: Secondary | ICD-10-CM

## 2024-03-02 NOTE — Telephone Encounter (Signed)
 Pt's wife would like a c/b regarding upcoming appt on 1/9

## 2024-03-02 NOTE — Telephone Encounter (Signed)
 Called and spoke to patient and  spouse. Appointment rescheduled per their request.

## 2024-03-09 ENCOUNTER — Ambulatory Visit: Admitting: Cardiology

## 2024-03-13 ENCOUNTER — Ambulatory Visit: Admitting: Cardiology

## 2024-03-18 NOTE — Progress Notes (Unsigned)
" °  Cardiology Office Note:   Date:  03/21/2024  ID:  Bruce Sanchez, DOB 02/06/1956, MRN [id number] PCP: Joshua Debby CROME, MD  [facility] Providers Cardiologist:  Lynwood Schilling, MD {  History of Present Illness:   Bruce Sanchez is a 69 y.o. male who presents for evaluation of aortic stenosis and SOB.   His AS was mild to moderate in April 2023.  He is noted to have coronary calcium .  However, he had a negative perfusion study in 2023.  He presented with chest pain in December 2025 and was found to have 80% severe ostial circ stenosis.  He had PCI.    He has residual distal RCA stenosis.  Since his procedure he has had less of the dyspnea that he started having when he was doing activities such as walking the dog.  The patient denies any new symptoms such as chest discomfort, neck or arm discomfort. There has been no new shortness of breath, PND or orthopnea. There have been no reported palpitations, presyncope or syncope.   ROS: As stated in the HPI and negative for all other systems.  Studies Reviewed:    EKG:     NA  Risk Assessment/Calculations:           Physical Exam:   VS:  BP (!) 154/70   Pulse 80   Ht 5' 10 (1.778 m)   Wt 197 lb 3.2 oz (89.4 kg)   SpO2 95%   BMI 28.30 kg/m    Wt Readings from Last 3 Encounters:  03/21/24 197 lb 3.2 oz (89.4 kg)  02/21/24 188 lb (85.3 kg)  02/10/24 188 lb (85.3 kg)     GEN: Well nourished, well developed in no acute distress NECK: No JVD; No carotid bruits CARDIAC: RRR, 3 out of 6 apical early peaking systolic murmur radiating slightly at the aortic outflow tract, no diastolic murmurs, rubs, gallops RESPIRATORY:  Clear to auscultation without rales, wheezing or rhonchi  ABDOMEN: Soft, non-tender, non-distended EXTREMITIES:  No edema; No deformity   ASSESSMENT AND PLAN:   Aortic stenosis:   This was moderate on echo in Dec 2025.  I am going to follow-up with an echocardiogram in December of this year.  HTN: His blood  pressure is at target.  No change in therapy.   DM: A1c is 5.9 which is down from the 8.5 levels that he was having.  He will follow-up with his primary provider.  Dyslipidemia:   LDL was 42.  This is improved.  No change in therapy.   Aortic root enlargement:   This was only 38 mm on CT in Dec 2025.  We can review this at the time of his echo but this is mild and can be followed clinically.   Sternal lesion:    This was noted on a previous CT in 2024 but not on the 01/2024 CT.     CAD:   The patient has no anginal symptoms since having his PCI.  He will continue with aggressive risk reduction.   He will continue with DAPT until he sees us  again in December.    Follow up with APP in Dec.    Signed, Lynwood Schilling, MD   "

## 2024-03-21 ENCOUNTER — Encounter: Payer: Self-pay | Admitting: Cardiology

## 2024-03-21 ENCOUNTER — Ambulatory Visit: Attending: Cardiology | Admitting: Cardiology

## 2024-03-21 VITALS — BP 154/70 | HR 80 | Ht 70.0 in | Wt 197.2 lb

## 2024-03-21 DIAGNOSIS — E785 Hyperlipidemia, unspecified: Secondary | ICD-10-CM

## 2024-03-21 DIAGNOSIS — E118 Type 2 diabetes mellitus with unspecified complications: Secondary | ICD-10-CM

## 2024-03-21 DIAGNOSIS — I350 Nonrheumatic aortic (valve) stenosis: Secondary | ICD-10-CM

## 2024-03-21 DIAGNOSIS — I25110 Atherosclerotic heart disease of native coronary artery with unstable angina pectoris: Secondary | ICD-10-CM

## 2024-03-21 NOTE — Patient Instructions (Signed)
 Medication Instructions:  Your physician recommends that you continue on your current medications as directed. Please refer to the Current Medication list given to you today.  *If you need a refill on your cardiac medications before your next appointment, please call your pharmacy*  Lab Work: NONE If you have labs (blood work) drawn today and your tests are completely normal, you will receive your results only by: MyChart Message (if you have MyChart) OR A paper copy in the mail If you have any lab test that is abnormal or we need to change your treatment, we will call you to review the results.  Testing/Procedures: Echocardiogram in Dec 2026 Your physician has requested that you have an echocardiogram. Echocardiography is a painless test that uses sound waves to create images of your heart. It provides your doctor with information about the size and shape of your heart and how well your hearts chambers and valves are working. This procedure takes approximately one hour. There are no restrictions for this procedure. Please do NOT wear cologne, perfume, aftershave, or lotions (deodorant is allowed). Please arrive 15 minutes prior to your appointment time.  Please note: We ask at that you not bring children with you during ultrasound (echo/ vascular) testing. Due to room size and safety concerns, children are not allowed in the ultrasound rooms during exams. Our front office staff cannot provide observation of children in our lobby area while testing is being conducted. An adult accompanying a patient to their appointment will only be allowed in the ultrasound room at the discretion of the ultrasound technician under special circumstances. We apologize for any inconvenience.   Follow-Up: At [facility], you and your health needs are our priority.  As part of our continuing mission to provide you with exceptional heart care, our providers are all part of one team.  This team includes  your primary Cardiologist (physician) and Advanced Practice Providers or APPs (Physician Assistants and Nurse Practitioners) who all work together to provide you with the care you need, when you need it.  Your next appointment:   Dec 2026 after echo  Provider:   Lynwood Schilling, MD    We recommend signing up for the patient portal called MyChart.  Sign up information is provided on this After Visit Summary.  MyChart is used to connect with patients for Virtual Visits (Telemedicine).  Patients are able to view lab/test results, encounter notes, upcoming appointments, etc.  Non-urgent messages can be sent to your provider as well.   To learn more about what you can do with MyChart, go to [email or web address].

## 2024-04-04 ENCOUNTER — Other Ambulatory Visit: Payer: Self-pay | Admitting: Internal Medicine

## 2024-04-04 DIAGNOSIS — I25118 Atherosclerotic heart disease of native coronary artery with other forms of angina pectoris: Secondary | ICD-10-CM

## 2024-04-04 DIAGNOSIS — I10 Essential (primary) hypertension: Secondary | ICD-10-CM

## 2024-04-10 IMAGING — DX DG CERVICAL SPINE COMPLETE 4+V
6 series · 6 of 6 positions shown · non-contrast
Comparison: None Available.

CLINICAL DATA: Neck pain.  No known injury.

EXAM:
CERVICAL SPINE - COMPLETE 4+ VIEW

[c-spine lat]
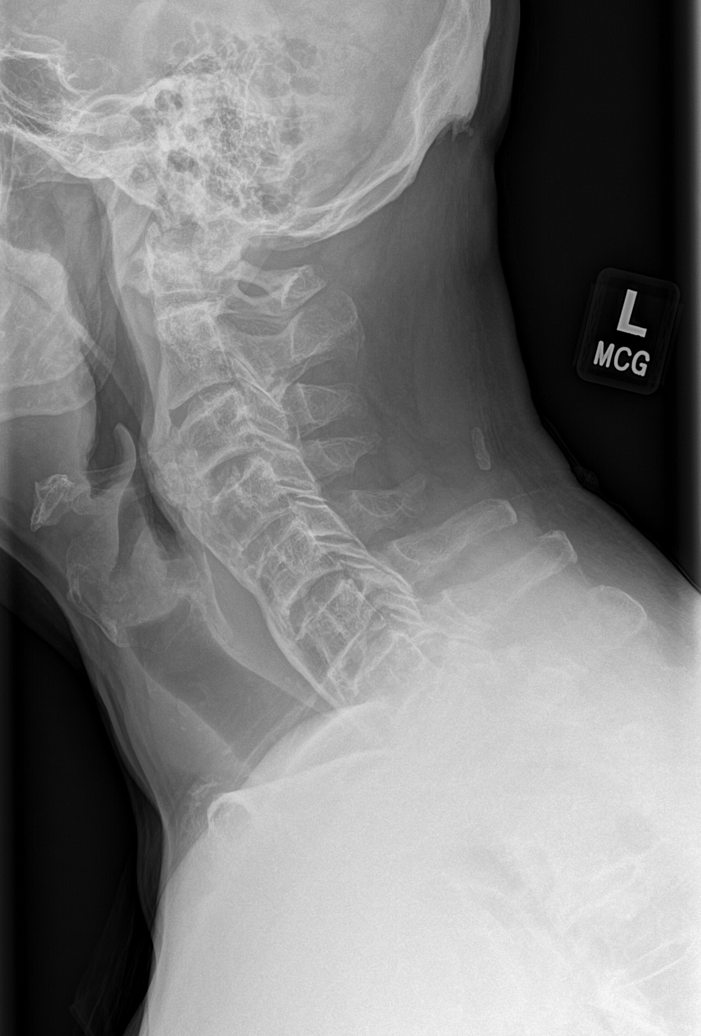

[c-spine obl (1 of 2)]
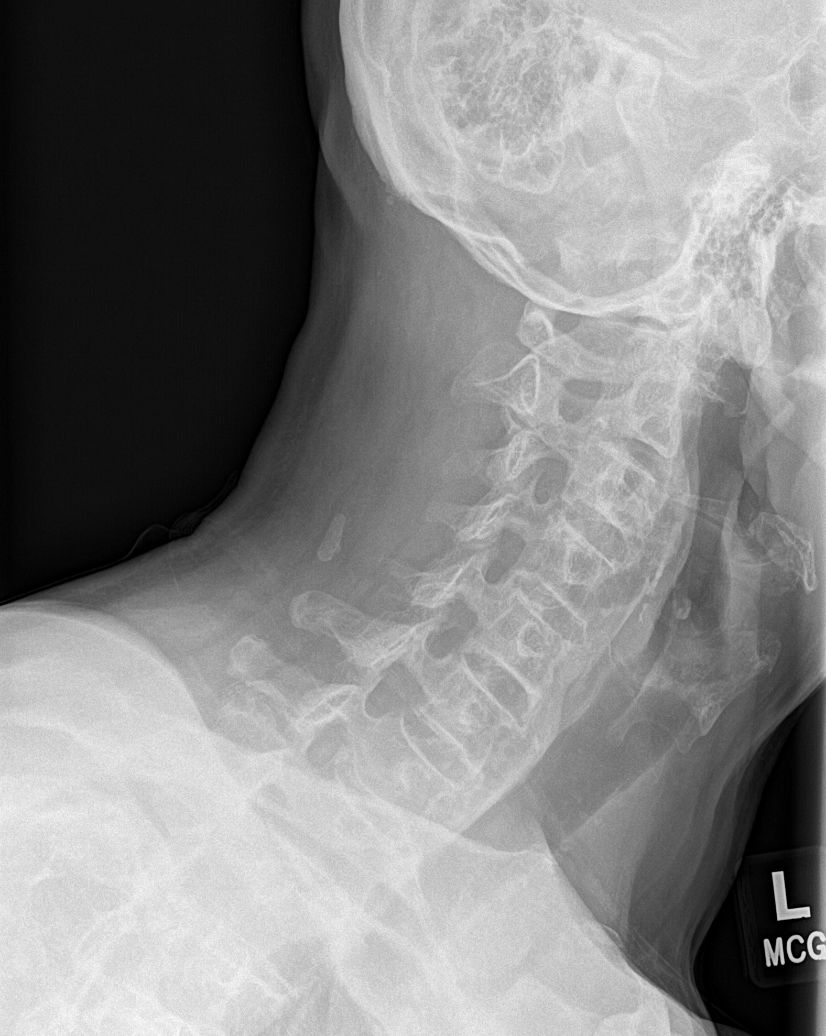

[c-spine obl (2 of 2)]
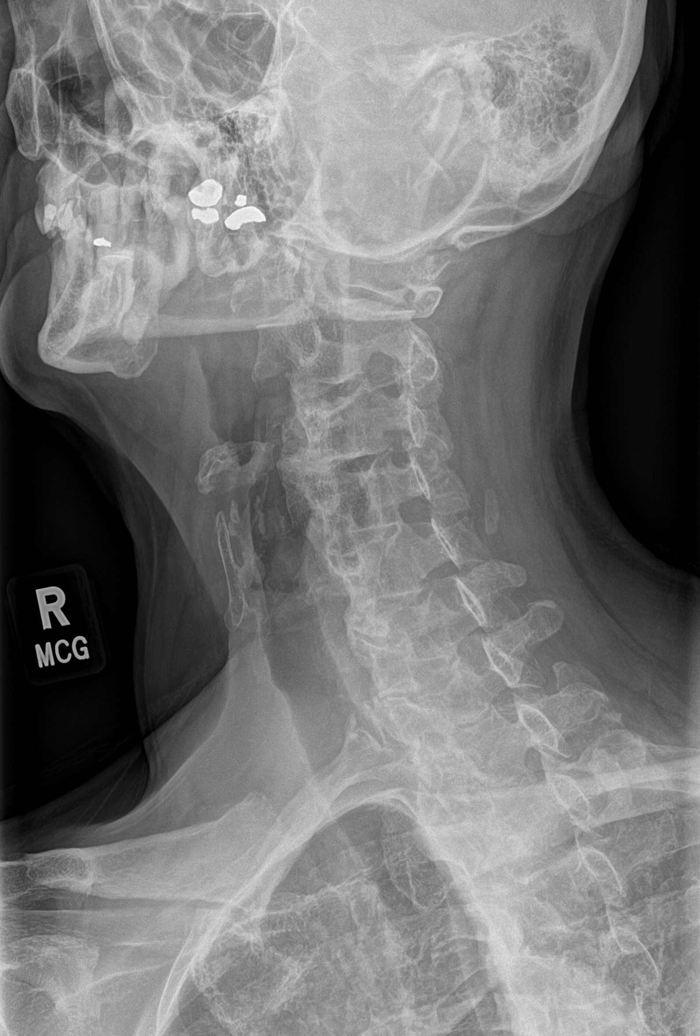

[c-spine ap]
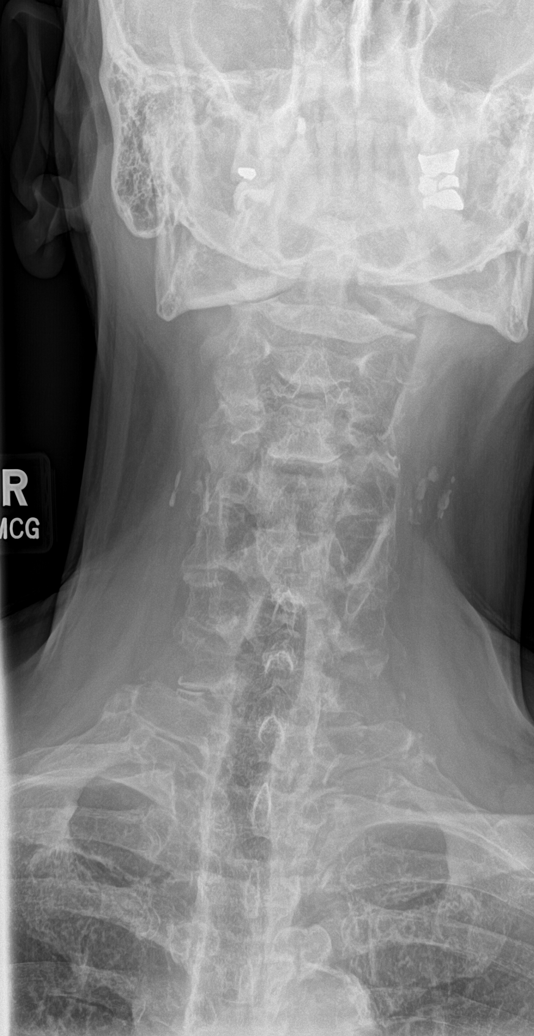

[c-spine open mouth]
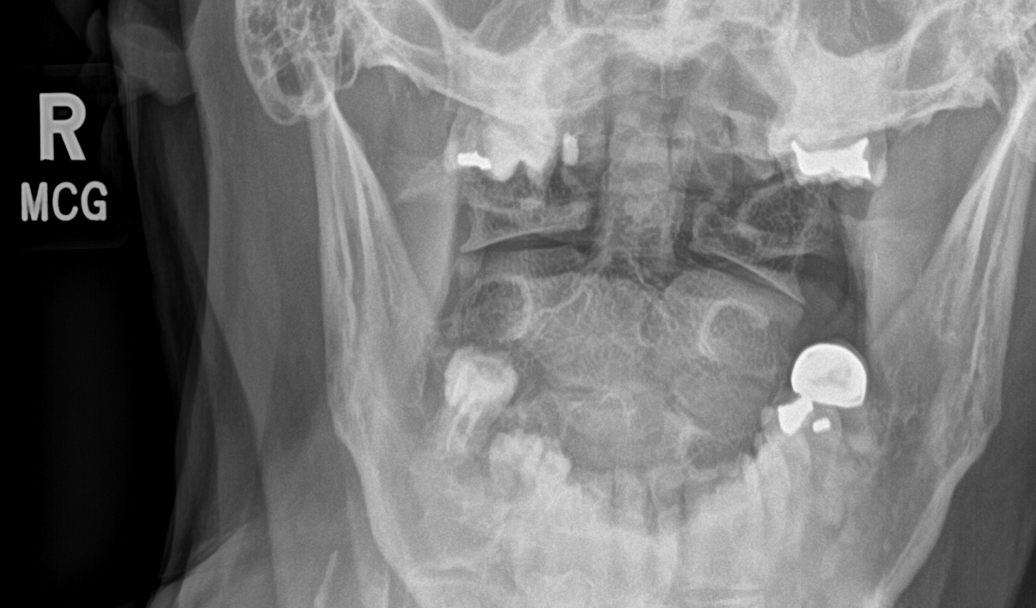

[ct-spine swimmers]
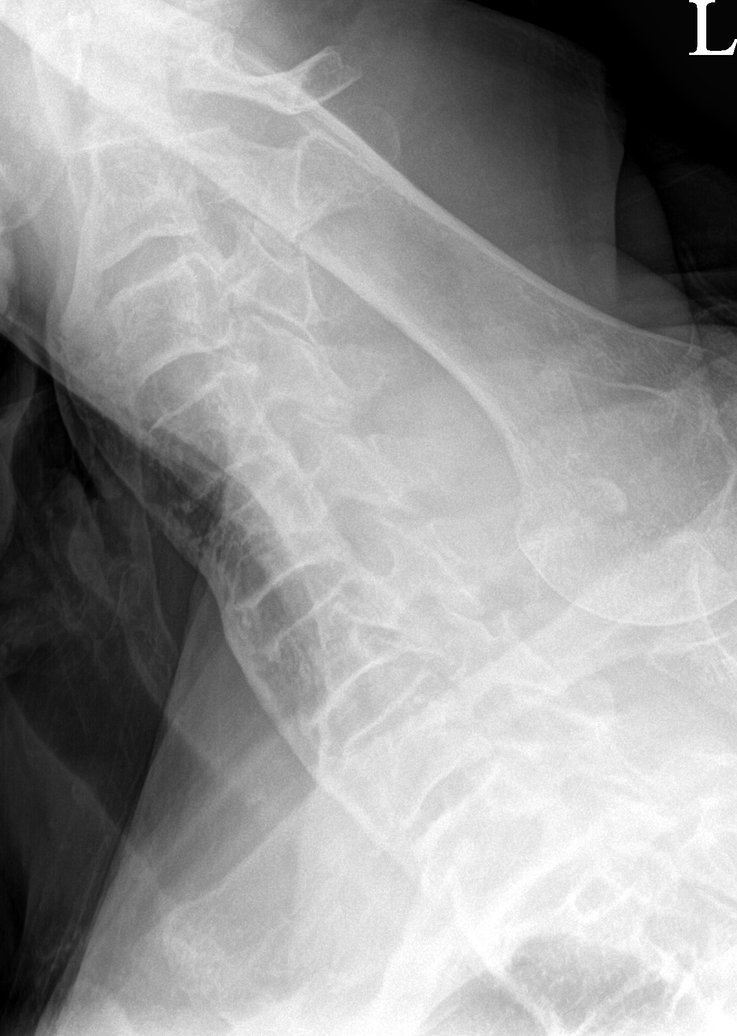

[6 of 6 positions shown; findings below may reference images not displayed]

FINDINGS: No acute fracture or dislocation. Apparent slight anterior
positioning of the tip of the odontoid in relation to the occipital
condyle, likely positional or related to degenerative changes. CT
may provide better evaluation if there is clinical concern for acute
injury. The bones are osteopenic. Bridging osteophyte anterior to
the spine consistent with diffuse idiopathic skeletal hyperostosis.
The visualized posterior elements and odontoid intact. There is
anatomic alignment of the lateral masses of C1 and C2. The soft
tissues are unremarkable.
IMPRESSION: 1. No acute fracture or dislocation.
2. Apparent slight anterior positioning of the tip of the odontoid
in relation to the occipital condyle, likely positional or related
to degenerative changes. CT may provide better evaluation.

## 2024-04-17 ENCOUNTER — Ambulatory Visit: Admitting: Internal Medicine

## 2024-04-18 ENCOUNTER — Ambulatory Visit: Admitting: Internal Medicine

## 2024-07-03 ENCOUNTER — Ambulatory Visit

## 2025-01-30 ENCOUNTER — Ambulatory Visit (HOSPITAL_COMMUNITY)
